# Patient Record
Sex: Male | Born: 1995 | Race: White | Hispanic: No | Marital: Single | Smoking: Former smoker
Health system: Southern US, Community
[De-identification: ages and names within clinical notes are randomized; demographics above are authoritative.]

## PROBLEM LIST (undated history)

## (undated) DIAGNOSIS — G43909 Migraine, unspecified, not intractable, without status migrainosus: Secondary | ICD-10-CM

## (undated) DIAGNOSIS — E079 Disorder of thyroid, unspecified: Secondary | ICD-10-CM

---
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
## 2013-01-21 ENCOUNTER — Other Ambulatory Visit: Payer: Self-pay | Admitting: Family Medicine

## 2013-01-24 NOTE — Telephone Encounter (Signed)
Last thyroid labs were abnormal and you said to recheck in 4-6 weeks, that was 05/26/12

## 2013-01-25 NOTE — Telephone Encounter (Signed)
Patient needs to be seen. Has exceeded time since last visit.he had needed additional labs. Limited quantity refilled. Needs to bring all medications to next appointment.

## 2013-01-26 NOTE — Telephone Encounter (Signed)
[facility] notiifed of refill unabale to reach pt via phone as numbers listed "not in service"

## 2013-01-31 ENCOUNTER — Encounter: Payer: Self-pay | Admitting: *Deleted

## 2013-01-31 NOTE — Telephone Encounter (Signed)
Letter mailed to pt per dr. Maurice March order.

## 2013-03-14 ENCOUNTER — Ambulatory Visit (INDEPENDENT_AMBULATORY_CARE_PROVIDER_SITE_OTHER): Payer: Medicaid Other | Admitting: Family Medicine

## 2013-03-14 ENCOUNTER — Encounter: Payer: Self-pay | Admitting: Family Medicine

## 2013-03-14 VITALS — BP 118/80 | HR 78 | Temp 97.9°F | Resp 18 | Ht 70.5 in | Wt 185.0 lb

## 2013-03-14 DIAGNOSIS — L309 Dermatitis, unspecified: Secondary | ICD-10-CM

## 2013-03-14 DIAGNOSIS — E039 Hypothyroidism, unspecified: Secondary | ICD-10-CM

## 2013-03-14 DIAGNOSIS — J029 Acute pharyngitis, unspecified: Secondary | ICD-10-CM

## 2013-03-14 DIAGNOSIS — L259 Unspecified contact dermatitis, unspecified cause: Secondary | ICD-10-CM

## 2013-03-14 DIAGNOSIS — J039 Acute tonsillitis, unspecified: Secondary | ICD-10-CM

## 2013-03-14 MED ORDER — AMOXICILLIN 500 MG PO CAPS: 500.0000 mg | ORAL_CAPSULE | Freq: Three times a day (TID) | ORAL | Status: DC

## 2013-03-14 MED ORDER — LEVOTHYROXINE SODIUM 75 MCG PO TABS: ORAL_TABLET | ORAL | Status: DC

## 2013-03-14 MED ORDER — HYDROCORTISONE 1 % EX CREA: 1.0000 "application " | TOPICAL_CREAM | Freq: Two times a day (BID) | CUTANEOUS | Status: DC

## 2013-03-14 NOTE — Patient Instructions (Signed)
Return in 3 months for lab work only Start antibiotics Warm salt water gargle Try the topical steroid on your face twice a day and moisturize F/U 6 months for thyroid

## 2013-03-14 NOTE — Assessment & Plan Note (Signed)
History of screen is negative. I would treat more of an acute tonsillitis with amoxicillin

## 2013-03-14 NOTE — Assessment & Plan Note (Signed)
He's not been on the Synthroid consistently of the past couple months due to lack of prescription. I will have him take it for the next 3 months grading he will come in for labs

## 2013-03-14 NOTE — Progress Notes (Signed)
   Subjective:    Patient ID: Mark Zuniga, male    DOB: [birth year], 17 y.o.   MRN: [id number]  HPI Patient here with a sore throat for the past one to 2 days as well as cough. His cough is minimal production. He feels like he has strep throat. He is painful swallowing. His mother also had strep throat a couple weeks ago. He's not had any fever. He's also concerned about a red spot on his face which flakes up and then goes away over the past couple of weeks. He denies any change in soap or lotion on his face. He has no history of eczema   Review of Systems - per above  GEN- denies fatigue, fever, weight loss,weakness, recent illness HEENT- denies eye drainage, change in vision, nasal discharge, CVS- denies chest pain, palpitations RESP- denies SOB, +cough, wheeze Neuro- denies headache, dizziness, syncope, seizure activity      Objective:   Physical Exam GEN- NAD, alert and oriented x3 HEENT- PERRL, EOMI, non injected sclera, pink conjunctiva, MMM, oropharynx +injection, +tonsilar exudates and enlargement,TM clear bilat no effusion,  No  maxillary sinus tenderness, inflammed turbinates,  Nasal drainage  Neck- Supple, no LAD, no thyromegaly CVS- RRR, no murmur RESP-CTAB EXT- No edema Pulses- Radial 2+ Skin- erythematous scaley region along left nasal bridge        Assessment & Plan:

## 2013-03-14 NOTE — Assessment & Plan Note (Signed)
Trial of topical hydrocortisone

## 2013-06-27 ENCOUNTER — Ambulatory Visit (INDEPENDENT_AMBULATORY_CARE_PROVIDER_SITE_OTHER): Payer: Medicaid Other | Admitting: Family Medicine

## 2013-06-27 ENCOUNTER — Encounter: Payer: Self-pay | Admitting: Family Medicine

## 2013-06-27 VITALS — BP 128/68 | HR 88 | Temp 97.6°F | Resp 18 | Ht 71.0 in | Wt 186.0 lb

## 2013-06-27 DIAGNOSIS — L408 Other psoriasis: Secondary | ICD-10-CM

## 2013-06-27 DIAGNOSIS — L309 Dermatitis, unspecified: Secondary | ICD-10-CM

## 2013-06-27 DIAGNOSIS — L259 Unspecified contact dermatitis, unspecified cause: Secondary | ICD-10-CM

## 2013-06-27 DIAGNOSIS — E039 Hypothyroidism, unspecified: Secondary | ICD-10-CM

## 2013-06-27 DIAGNOSIS — L409 Psoriasis, unspecified: Secondary | ICD-10-CM | POA: Insufficient documentation

## 2013-06-27 LAB — CBC WITH DIFFERENTIAL/PLATELET
BASOS PCT: 0 % (ref 0–1)
Basophils Absolute: 0 10*3/uL (ref 0.0–0.1)
Eosinophils Absolute: 0.6 10*3/uL (ref 0.0–0.7)
Eosinophils Relative: 8 % — ABNORMAL HIGH (ref 0–5)
HEMATOCRIT: 48.3 % (ref 39.0–52.0)
Hemoglobin: 16.7 g/dL (ref 13.0–17.0)
LYMPHS PCT: 27 % (ref 12–46)
Lymphs Abs: 2 10*3/uL (ref 0.7–4.0)
MCH: 28.8 pg (ref 26.0–34.0)
MCHC: 34.6 g/dL (ref 30.0–36.0)
MCV: 83.4 fL (ref 78.0–100.0)
MONO ABS: 0.5 10*3/uL (ref 0.1–1.0)
Monocytes Relative: 7 % (ref 3–12)
NEUTROS ABS: 4.3 10*3/uL (ref 1.7–7.7)
NEUTROS PCT: 58 % (ref 43–77)
PLATELETS: 274 10*3/uL (ref 150–400)
RBC: 5.79 MIL/uL (ref 4.22–5.81)
RDW: 13.6 % (ref 11.5–15.5)
WBC: 7.4 10*3/uL (ref 4.0–10.5)

## 2013-06-27 MED ORDER — DERMA-SMOOTHE/FS SCALP 0.01 % EX OIL: TOPICAL_OIL | CUTANEOUS | Status: DC

## 2013-06-27 MED ORDER — TRIAMCINOLONE ACETONIDE 0.1 % EX CREA: 1.0000 "application " | TOPICAL_CREAM | Freq: Two times a day (BID) | CUTANEOUS | Status: DC

## 2013-06-27 NOTE — Progress Notes (Signed)
Patient ID: Mark Zuniga, male   DOB: 08-15-1995, 18 y.o.   MRN: [id number]   Subjective:    Patient ID: Mark Zuniga, male    DOB: 08-15-1995, 18 y.o.   MRN: [id number]  Patient presents for skin flakes and medicaiton review and refill  Here for medication review as well as skin rash. He has a history of hypothyroidism he's not been taking Synthroid daily as prescribed and is overdue for labs. He has noticed that he has a dry flaky patch which turns red on the side of his left nares which continues to recur he did use hydrocortisone in it which I gave his last visit this only helped minimally he now has spots on his scalp and states that he always has very severe dandruff in his use every over-the-counter medication and to shampoo with no improvement he now gets scabs in the scales in his scalp   Note he is on disability 2 to a severe injury in his back Review Of Systems:  GEN- denies fatigue, fever, weight loss,weakness, recent illness HEENT- denies eye drainage, change in vision, nasal discharge, CVS- denies chest pain, palpitations RESP- denies SOB, cough, wheeze MSK- denies joint pain, muscle aches, injury Neuro- denies headache, dizziness, syncope, seizure activity       Objective:    BP 128/68  Pulse 88  Temp(Src) 97.6 F (36.4 C) (Oral)  Resp 18  Ht 5\' 11"  (1.803 m)  Wt 186 lb (84.369 kg)  BMI 25.95 kg/m2 GEN- NAD, alert and oriented x3 HEENT- PERRL, EOMI, non injected sclera, pink conjunctiva, MMM, oropharynx clear Neck- Supple, no thyromegaly CVS- RRR, no murmur RESP-CTAB sKIN- erythematous papules in scalp along front hair line and scattered in back, 1 lesion post pinna,with white scales, dandruff throughout scalp, erythematous scaly lesion on left side nares  EXT- No edema Pulses- Radial, DP- 2+         Assessment & Plan:      Problem List Items Addressed This Visit   Unspecified hypothyroidism - Primary     Check TFT, restart synthroid    Relevant Orders      CBC with Differential      Comprehensive metabolic panel      TSH      T3, Free      T4, Free   Dermatitis      I query if This is a seborrheic dermatitis versus psoriasis in his scalp. I will try him on Derma-Smoothe and also refer him to dermatology I have also given him triamcinolone cream for his face    Relevant Medications      Fluocinolone Acetonide (DERMA-SMOOTHE/FS SCALP) 0.01 % OIL      TRIAMCINOLONE ACETONIDE 0.1% EX CREA      Note: This dictation was prepared with Dragon dictation along with smaller phrase technology. Any transcriptional errors that result from this process are unintentional.

## 2013-06-27 NOTE — Assessment & Plan Note (Signed)
I query if This is a seborrheic dermatitis versus psoriasis in his scalp. I will try him on Derma-Smoothe and also refer him to dermatology I have also given him triamcinolone cream for his face

## 2013-06-27 NOTE — Patient Instructions (Signed)
Apply the new scalp treatment Use a tar based shampoo over the counter Use the cream on face Referral to dermatology We will call with lab results F/U 6 months for physical

## 2013-06-27 NOTE — Assessment & Plan Note (Signed)
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

## 2013-06-27 NOTE — Assessment & Plan Note (Signed)
Check TFT, restart synthroid

## 2013-06-28 LAB — COMPREHENSIVE METABOLIC PANEL
ALBUMIN: 5.2 g/dL (ref 3.5–5.2)
ALK PHOS: 78 U/L (ref 39–117)
ALT: 17 U/L (ref 0–53)
AST: 20 U/L (ref 0–37)
BUN: 10 mg/dL (ref 6–23)
CALCIUM: 9.9 mg/dL (ref 8.4–10.5)
CHLORIDE: 105 meq/L (ref 96–112)
CO2: 24 mEq/L (ref 19–32)
Creat: 0.74 mg/dL (ref 0.50–1.35)
Glucose, Bld: 88 mg/dL (ref 70–99)
POTASSIUM: 4.3 meq/L (ref 3.5–5.3)
SODIUM: 139 meq/L (ref 135–145)
TOTAL PROTEIN: 7.3 g/dL (ref 6.0–8.3)
Total Bilirubin: 0.8 mg/dL (ref 0.2–1.1)

## 2013-06-28 LAB — T4, FREE: Free T4: 1.38 ng/dL (ref 0.80–1.80)

## 2013-06-28 LAB — T3, FREE: T3, Free: 4 pg/mL (ref 2.3–4.2)

## 2013-06-28 LAB — TSH: TSH: 5.843 u[IU]/mL — ABNORMAL HIGH (ref 0.350–4.500)

## 2013-06-29 ENCOUNTER — Other Ambulatory Visit: Payer: Self-pay | Admitting: *Deleted

## 2013-06-29 DIAGNOSIS — E039 Hypothyroidism, unspecified: Secondary | ICD-10-CM

## 2013-06-29 MED ORDER — LEVOTHYROXINE SODIUM 75 MCG PO TABS: ORAL_TABLET | ORAL | Status: DC

## 2013-06-29 NOTE — Telephone Encounter (Signed)
Per orders noted on labs, medication sent to pharmacy.   Call placed to patient and patient made aware. 

## 2013-08-26 ENCOUNTER — Encounter (HOSPITAL_COMMUNITY): Payer: Self-pay | Admitting: Emergency Medicine

## 2013-08-26 ENCOUNTER — Emergency Department (INDEPENDENT_AMBULATORY_CARE_PROVIDER_SITE_OTHER)
Admission: EM | Admit: 2013-08-26 | Discharge: 2013-08-26 | Disposition: A | Discharge status: Home / Self Care | Payer: Medicaid Other | Source: Home / Self Care | Attending: Family Medicine | Admitting: Family Medicine

## 2013-08-26 DIAGNOSIS — J45909 Unspecified asthma, uncomplicated: Secondary | ICD-10-CM

## 2013-08-26 MED ORDER — TRIAMCINOLONE ACETONIDE 40 MG/ML IJ SUSP
INTRAMUSCULAR | Status: AC
  Filled 2013-08-26: qty 1

## 2013-08-26 MED ORDER — AZITHROMYCIN 250 MG PO TABS: ORAL_TABLET | ORAL | Status: DC

## 2013-08-26 MED ORDER — METHYLPREDNISOLONE ACETATE 40 MG/ML IJ SUSP
80.0000 mg | Freq: Once | INTRAMUSCULAR | Status: AC
  Administered 2013-08-26: 80 mg via INTRAMUSCULAR

## 2013-08-26 MED ORDER — HYDROCOD POLST-CHLORPHEN POLST 10-8 MG/5ML PO LQCR: 5.0000 mL | Freq: Two times a day (BID) | ORAL | Status: DC

## 2013-08-26 MED ORDER — METHYLPREDNISOLONE ACETATE 80 MG/ML IJ SUSP
INTRAMUSCULAR | Status: AC
  Filled 2013-08-26: qty 1

## 2013-08-26 MED ORDER — TRIAMCINOLONE ACETONIDE 40 MG/ML IJ SUSP
40.0000 mg | Freq: Once | INTRAMUSCULAR | Status: AC
  Administered 2013-08-26: 40 mg via INTRAMUSCULAR

## 2013-08-26 NOTE — ED Notes (Signed)
Pt  Reports  Symptoms  Of  Cough  /  Congested   And  Pain in  Sides  When  He  Coughs            He  Is  Sitting  Upright on  Exam table     Speaking       In  Complete  sentances  And  Is  In no  distress    Pt is  A  Smoker

## 2013-08-26 NOTE — ED Provider Notes (Signed)
CSN: [id number]     Arrival date & time 08/26/13  2694 History   First MD Initiated Contact with Patient 08/26/13 1055     Chief Complaint  Patient presents with  . Cough   (Consider location/radiation/quality/duration/timing/severity/associated sxs/prior Treatment) Patient is a 18 y.o. male presenting with cough. The history is provided by the patient and a parent.  Cough Cough characteristics:  Non-productive and dry Severity:  Mild Onset quality:  Gradual Duration:  3 days Chronicity:  New Smoker: yes (quit yest.)   Associated symptoms: no chills, no fever, no rhinorrhea, no shortness of breath, no sinus congestion, no sore throat and no wheezing     Past Medical History  Diagnosis Date  . Thyroid disease    History reviewed. No pertinent past surgical history. History reviewed. No pertinent family history. History  Substance Use Topics  . Smoking status: Current Every Day Smoker -- 0.75 packs/day    Types: Cigarettes  . Smokeless tobacco: Not on file  . Alcohol Use: No    Review of Systems  Constitutional: Negative.  Negative for fever and chills.  HENT: Negative for congestion, postnasal drip, rhinorrhea, sinus pressure and sore throat.   Respiratory: Positive for cough. Negative for choking, chest tightness, shortness of breath and wheezing.   Cardiovascular: Negative.   Gastrointestinal: Negative.     Allergies  Sulfa drugs cross reactors  Home Medications   Prior to Admission medications   Medication Sig Start Date End Date Taking? Authorizing Provider  azithromycin (ZITHROMAX Z-PAK) 250 MG tablet Take as directed on pack 08/26/13   Linna Hoff, MD  chlorpheniramine-HYDROcodone [facility] PENNKINETIC ER) 10-8 MG/5ML LQCR Take 5 mLs by mouth every 12 (twelve) hours. 08/26/13   Linna Hoff, MD  Fluocinolone Acetonide (DERMA-SMOOTHE/FS SCALP) 0.01 % OIL Wet scalp apply oil place shower cap on, rinse in AM, 3 times a week 06/27/13   Salley Scarlet, MD   hydrocortisone cream 1 % Apply 1 application topically 2 (two) times daily. 03/14/13   Salley Scarlet, MD  levothyroxine (SYNTHROID, LEVOTHROID) 75 MCG tablet TAKE 1 TABLET BY MOUTH ONCE A DAY 06/29/13   Salley Scarlet, MD  triamcinolone cream (KENALOG) 0.1 % Apply 1 application topically 2 (two) times daily. Apply to face twice a day 06/27/13   Salley Scarlet, MD   BP 129/78  Pulse 70  Temp(Src) 97.5 F (36.4 C) (Oral)  Resp 18  SpO2 97% Physical Exam  Nursing note and vitals reviewed. Constitutional: He is oriented to person, place, and time. He appears well-developed and well-nourished.  HENT:  Right Ear: External ear normal.  Left Ear: External ear normal.  Mouth/Throat: Oropharynx is clear and moist.  Eyes: Conjunctivae are normal. Pupils are equal, round, and reactive to light.  Neck: Normal range of motion. Neck supple.  Cardiovascular: Regular rhythm, normal heart sounds and intact distal pulses.   Pulmonary/Chest: Effort normal and breath sounds normal.  Lymphadenopathy:    He has no cervical adenopathy.  Neurological: He is alert and oriented to person, place, and time.  Skin: Skin is warm and dry.    ED Course  Procedures (including critical care time) Labs Review Labs Reviewed - No data to display  Imaging Review No results found.   MDM   1. Allergic bronchitis        Linna Hoff, MD 08/27/13 1224

## 2013-09-12 ENCOUNTER — Ambulatory Visit: Payer: Medicaid Other | Admitting: Family Medicine

## 2013-11-14 ENCOUNTER — Ambulatory Visit (INDEPENDENT_AMBULATORY_CARE_PROVIDER_SITE_OTHER): Payer: Medicaid Other | Admitting: Family Medicine

## 2013-11-14 ENCOUNTER — Encounter: Payer: Self-pay | Admitting: Family Medicine

## 2013-11-14 VITALS — BP 128/74 | HR 64 | Temp 97.6°F | Resp 12 | Ht 71.0 in | Wt 175.0 lb

## 2013-11-14 DIAGNOSIS — G4489 Other headache syndrome: Secondary | ICD-10-CM

## 2013-11-14 DIAGNOSIS — IMO0002 Reserved for concepts with insufficient information to code with codable children: Secondary | ICD-10-CM

## 2013-11-14 DIAGNOSIS — E039 Hypothyroidism, unspecified: Secondary | ICD-10-CM

## 2013-11-14 DIAGNOSIS — R55 Syncope and collapse: Secondary | ICD-10-CM

## 2013-11-14 DIAGNOSIS — R519 Headache, unspecified: Secondary | ICD-10-CM | POA: Insufficient documentation

## 2013-11-14 DIAGNOSIS — R51 Headache: Secondary | ICD-10-CM

## 2013-11-14 DIAGNOSIS — M5416 Radiculopathy, lumbar region: Secondary | ICD-10-CM

## 2013-11-14 LAB — CBC WITH DIFFERENTIAL/PLATELET
Basophils Absolute: 0 10*3/uL (ref 0.0–0.1)
Basophils Relative: 0 % (ref 0–1)
EOS PCT: 5 % (ref 0–5)
Eosinophils Absolute: 0.6 10*3/uL (ref 0.0–0.7)
HEMATOCRIT: 44.4 % (ref 39.0–52.0)
HEMOGLOBIN: 16.1 g/dL (ref 13.0–17.0)
LYMPHS ABS: 2.4 10*3/uL (ref 0.7–4.0)
Lymphocytes Relative: 21 % (ref 12–46)
MCH: 29.3 pg (ref 26.0–34.0)
MCHC: 36.3 g/dL — ABNORMAL HIGH (ref 30.0–36.0)
MCV: 80.7 fL (ref 78.0–100.0)
MONOS PCT: 6 % (ref 3–12)
Monocytes Absolute: 0.7 10*3/uL (ref 0.1–1.0)
NEUTROS ABS: 7.6 10*3/uL (ref 1.7–7.7)
Neutrophils Relative %: 68 % (ref 43–77)
Platelets: 291 10*3/uL (ref 150–400)
RBC: 5.5 MIL/uL (ref 4.22–5.81)
RDW: 13.6 % (ref 11.5–15.5)
WBC: 11.2 10*3/uL — ABNORMAL HIGH (ref 4.0–10.5)

## 2013-11-14 LAB — COMPREHENSIVE METABOLIC PANEL
ALK PHOS: 81 U/L (ref 39–117)
ALT: 14 U/L (ref 0–53)
AST: 12 U/L (ref 0–37)
Albumin: 5.2 g/dL (ref 3.5–5.2)
BUN: 13 mg/dL (ref 6–23)
CALCIUM: 10.1 mg/dL (ref 8.4–10.5)
CO2: 23 mEq/L (ref 19–32)
CREATININE: 0.78 mg/dL (ref 0.50–1.35)
Chloride: 102 mEq/L (ref 96–112)
Glucose, Bld: 86 mg/dL (ref 70–99)
Potassium: 4.1 mEq/L (ref 3.5–5.3)
Sodium: 140 mEq/L (ref 135–145)
Total Bilirubin: 0.7 mg/dL (ref 0.2–1.2)
Total Protein: 7.6 g/dL (ref 6.0–8.3)

## 2013-11-14 LAB — T3, FREE: T3, Free: 4.5 pg/mL — ABNORMAL HIGH (ref 2.3–4.2)

## 2013-11-14 LAB — T4, FREE: FREE T4: 1.44 ng/dL (ref 0.80–1.80)

## 2013-11-14 LAB — TSH: TSH: 6.473 u[IU]/mL — ABNORMAL HIGH (ref 0.350–4.500)

## 2013-11-14 MED ORDER — DIAZEPAM 5 MG PO TABS: ORAL_TABLET | ORAL | Status: DC

## 2013-11-14 NOTE — Patient Instructions (Signed)
Release of information - [facility]- All records MRI of brain to be done  F/U pending results

## 2013-11-14 NOTE — Progress Notes (Signed)
Patient ID: Mark Zuniga, male   DOB: 1995/05/31, 18 y.o.   MRN: [id number]   Subjective:    Patient ID: Mark Zuniga, male    DOB: 1995/05/31, 18 y.o.   MRN: [id number]  Patient presents for fainting episode  patient here secondary to recurrent syncopal episodes. His mother states he has had syncopal episodes since the age of 18. He's had some workup by our office and previous pediatrician in the past he was seen by cardiology he had a stress test done as well as a Holter monitor which was negative. Sometimes these episodes were associated with severe back pain he is [facility] Dr. 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 Early this morning he had another one of the syncopal episodes that he did not have any pain associated. He does walk around the house when he became subsequently dizzy he also had a headache went to lay down on his bed and his cousin witnessed that he fell backwards on his bed his eyes roll in his head and that his arms curled. Patient states that he felt dizzy therefore he went and laid down and he thinks that he went to sleep he is not sure about the arms curled. His mother is very concerned as she started with headaches and syncopal episodes when her brain aneurysm was diagnosed which had to be coiled    Review Of Systems:  GEN- denies fatigue, fever, weight loss,weakness, recent illness HEENT- denies eye drainage, change in vision, nasal discharge, CVS- denies chest pain, palpitations RESP- denies SOB, cough, wheeze ABD- denies N/V, change in stools, abd pain GU- denies dysuria, hematuria, dribbling, incontinence MSK- + joint pain, muscle aches, injury Neuro+s headache, +dizziness, +syncope, seizure activity       Objective:    BP 128/74  Pulse 64  Temp(Src) 97.6 F (36.4 C)  (Oral)  Resp 12  Ht 5\' 11"  (1.803 m)  Wt 175 lb (79.379 kg)  BMI 24.42 kg/m2 GEN- NAD, alert and oriented x3 HEENT- PERRL, EOMI, non injected sclera, pink conjunctiva, MMM, oropharynx clear Neck- Supple, no thyromegaly CVS- RRR, no murmur RESP-CTAB NEURO-CNII-XII in tact, DTR symmetric, sensation in tact, motor equal bilat, normal speech Psych- normal affect and mood EXT- No edema Pulses- Radial 2+        Assessment & Plan:      Problem List Items Addressed This Visit   Unspecified hypothyroidism   Relevant Orders      TSH      T3, free      T4, free   Syncope   Relevant Orders      CBC with Differential      Comprehensive metabolic panel   Radiculopathy of lumbar region - Primary   Relevant Medications      diazepam (VALIUM) tablet   Headache      Note: This dictation was prepared with Dragon dictation along with smaller phrase technology. Any transcriptional errors that result from this process are unintentional.

## 2013-11-16 ENCOUNTER — Encounter: Payer: Self-pay | Admitting: Family Medicine

## 2013-11-16 NOTE — Assessment & Plan Note (Signed)
Discussed importance of taking medications on regular basis

## 2013-11-16 NOTE — Assessment & Plan Note (Signed)
Significant work up in the past, I will review his chart  Obtain MRI of brain family history of anuersym in setting of continued symptoms and headaches, not sure about correlation with back pain

## 2013-12-06 ENCOUNTER — Other Ambulatory Visit: Payer: Self-pay | Admitting: Family Medicine

## 2013-12-06 DIAGNOSIS — R55 Syncope and collapse: Secondary | ICD-10-CM

## 2013-12-06 DIAGNOSIS — G4489 Other headache syndrome: Secondary | ICD-10-CM

## 2013-12-06 DIAGNOSIS — Z8679 Personal history of other diseases of the circulatory system: Secondary | ICD-10-CM

## 2013-12-11 ENCOUNTER — Telehealth: Payer: Self-pay | Admitting: *Deleted

## 2013-12-11 ENCOUNTER — Telehealth: Payer: Self-pay | Admitting: Family Medicine

## 2013-12-11 DIAGNOSIS — Z8249 Family history of ischemic heart disease and other diseases of the circulatory system: Secondary | ICD-10-CM

## 2013-12-11 NOTE — Telephone Encounter (Signed)
Medication ordered on 11/14/2013.  Call placed to patient mother. Reports that pharmacy does prescription.   Medication called to pharmacy.

## 2013-12-11 NOTE — Telephone Encounter (Signed)
[phone] Marylu Lund hurd mother is calling to make sure that we called in some medication before Mark Zuniga had his mri done

## 2013-12-11 NOTE — Telephone Encounter (Signed)
Received call from [facility] at [facility] stating that pt has appt on Wednesday for MRI and she stated that the radilogy tech is requesting for pt to also have MRA of Head. ?OK to do referral?

## 2013-12-11 NOTE — Telephone Encounter (Signed)
Okay, this is due to Anuerysm in family history

## 2013-12-12 NOTE — Telephone Encounter (Signed)
Referral place for MRA

## 2013-12-13 ENCOUNTER — Ambulatory Visit
Admission: RE | Admit: 2013-12-13 | Discharge: 2013-12-13 | Disposition: A | Discharge status: Home / Self Care | Payer: Medicaid Other | Source: Ambulatory Visit | Attending: Family Medicine | Admitting: Family Medicine

## 2013-12-13 ENCOUNTER — Telehealth: Payer: Self-pay | Admitting: Family Medicine

## 2013-12-13 DIAGNOSIS — R55 Syncope and collapse: Secondary | ICD-10-CM

## 2013-12-13 DIAGNOSIS — Z8679 Personal history of other diseases of the circulatory system: Secondary | ICD-10-CM

## 2013-12-13 DIAGNOSIS — G4489 Other headache syndrome: Secondary | ICD-10-CM

## 2013-12-13 DIAGNOSIS — Z8249 Family history of ischemic heart disease and other diseases of the circulatory system: Secondary | ICD-10-CM

## 2013-12-14 ENCOUNTER — Telehealth: Payer: Self-pay | Admitting: Family Medicine

## 2013-12-14 NOTE — Telephone Encounter (Signed)
Patients mom is calling about his mri results let her know that dr Jeanice Lim and you as well would be back tomorrow if those results were in  [phone]

## 2013-12-15 ENCOUNTER — Telehealth: Payer: Self-pay | Admitting: Family Medicine

## 2013-12-15 ENCOUNTER — Other Ambulatory Visit: Payer: Self-pay | Admitting: *Deleted

## 2013-12-15 MED ORDER — TOPIRAMATE 25 MG PO TABS: 25.0000 mg | ORAL_TABLET | Freq: Every day | ORAL | Status: DC

## 2013-12-15 NOTE — Telephone Encounter (Signed)
PATIENTS MOM JANET CALLING TO TALK WITH YOU REGARDING Kamarrion'S MRI RESULTS SHE SAID THAT WHEN YOU SPOKE TO Yosmar THIS MORNING HE WAS HALF ASLEEP AND WANTED TO MAKE SURE THAT EVERY THING WAS UNDERSTOOD AND WAS CLEAR   [phone]

## 2013-12-15 NOTE — Telephone Encounter (Signed)
Call placed to patient and patient made aware.  

## 2013-12-15 NOTE — Telephone Encounter (Signed)
Returned call to patient mother & results given again.

## 2013-12-27 ENCOUNTER — Encounter: Payer: Medicaid Other | Admitting: Family Medicine

## 2013-12-29 ENCOUNTER — Ambulatory Visit: Payer: Medicaid Other | Admitting: Family Medicine

## 2014-01-07 ENCOUNTER — Encounter (HOSPITAL_COMMUNITY): Payer: Self-pay | Admitting: Emergency Medicine

## 2014-01-07 ENCOUNTER — Emergency Department (HOSPITAL_COMMUNITY)
Admission: EM | Admit: 2014-01-07 | Discharge: 2014-01-07 | Disposition: A | Discharge status: Home / Self Care | Payer: Medicaid Other | Attending: Emergency Medicine | Admitting: Emergency Medicine

## 2014-01-07 DIAGNOSIS — Z7952 Long term (current) use of systemic steroids: Secondary | ICD-10-CM | POA: Diagnosis not present

## 2014-01-07 DIAGNOSIS — R51 Headache: Secondary | ICD-10-CM

## 2014-01-07 DIAGNOSIS — E079 Disorder of thyroid, unspecified: Secondary | ICD-10-CM | POA: Diagnosis not present

## 2014-01-07 DIAGNOSIS — G43909 Migraine, unspecified, not intractable, without status migrainosus: Secondary | ICD-10-CM | POA: Diagnosis present

## 2014-01-07 DIAGNOSIS — Z87891 Personal history of nicotine dependence: Secondary | ICD-10-CM | POA: Insufficient documentation

## 2014-01-07 DIAGNOSIS — R519 Headache, unspecified: Secondary | ICD-10-CM

## 2014-01-07 HISTORY — DX: Migraine, unspecified, not intractable, without status migrainosus: G43.909

## 2014-01-07 MED ORDER — KETOROLAC TROMETHAMINE 60 MG/2ML IM SOLN
60.0000 mg | Freq: Once | INTRAMUSCULAR | Status: AC
  Administered 2014-01-07: 60 mg via INTRAMUSCULAR
  Filled 2014-01-07: qty 2

## 2014-01-07 MED ORDER — DIPHENHYDRAMINE HCL 50 MG/ML IJ SOLN
25.0000 mg | Freq: Once | INTRAMUSCULAR | Status: AC
  Administered 2014-01-07: 25 mg via INTRAMUSCULAR
  Filled 2014-01-07: qty 1

## 2014-01-07 MED ORDER — METOCLOPRAMIDE HCL 5 MG/ML IJ SOLN
10.0000 mg | Freq: Once | INTRAMUSCULAR | Status: AC
  Administered 2014-01-07: 10 mg via INTRAMUSCULAR
  Filled 2014-01-07: qty 2

## 2014-01-07 NOTE — ED Notes (Signed)
Pt. reports migraine headache with nausea and blurred vision onset this evening unrelieved by prescription Topamax.

## 2014-01-07 NOTE — ED Provider Notes (Signed)
CSN: [id number]     Arrival date & time 01/07/14  0006 History   First MD Initiated Contact with Patient 01/07/14 0159     Chief Complaint  Patient presents with  . Migraine     (Consider location/radiation/quality/duration/timing/severity/associated sxs/prior Treatment) HPI Comments: Patient here complaining of worsening chronic migraine since yesterday afternoon. No known trigger. Headache is localized to his right frontal region and is similar to prior migraines. He notes nausea without vomiting. No fever or chills. No neck pain or stiffness. Patient notes photophobia without phonophobia. Denies any unilateral weakness. Take a dose of Topamax without relief. Patient was on chronic Topamax therapy but recently stopped that about a week ago. Nothing makes his symptoms better.  Patient is a 18 y.o. male presenting with migraines. The history is provided by the patient and a parent.  Migraine    Past Medical History  Diagnosis Date  . Thyroid disease   . Migraine    History reviewed. No pertinent past surgical history. No family history on file. History  Substance Use Topics  . Smoking status: Former Smoker -- 0.75 packs/day    Types: Cigarettes  . Smokeless tobacco: Never Used     Comment: quit 8/14/21015  . Alcohol Use: No    Review of Systems  All other systems reviewed and are negative.     Allergies  Sulfa drugs cross reactors  Home Medications   Prior to Admission medications   Medication Sig Start Date End Date Taking? Authorizing Provider  diazepam (VALIUM) 5 MG tablet Take 1 hour before MRI 11/14/13   Salley ScarletKawanta F [place], MD  hydrocortisone cream 1 % Apply 1 application topically 2 (two) times daily. 03/14/13   Salley ScarletKawanta F [place], MD  levothyroxine (SYNTHROID, LEVOTHROID) 75 MCG tablet TAKE 1 TABLET BY MOUTH ONCE A DAY 06/29/13   Salley ScarletKawanta F Robards, MD  topiramate (TOPAMAX) 25 MG tablet Take 1 tablet (25 mg total) by mouth at bedtime. 12/15/13   Salley ScarletKawanta F Blanco, MD   triamcinolone cream (KENALOG) 0.1 % Apply 1 application topically 2 (two) times daily. Apply to face twice a day 06/27/13   Salley ScarletKawanta F Chino, MD   BP 136/74  Pulse 72  Temp(Src) 98.4 F (36.9 C) (Oral)  Resp 18  Ht 5\' 4"  (1.626 m)  Wt 180 lb (81.647 kg)  BMI 30.88 kg/m2  SpO2 98% Physical Exam  Nursing note and vitals reviewed. Constitutional: He is oriented to person, place, and time. He appears well-developed and well-nourished.  Non-toxic appearance. No distress.  HENT:  Head: Normocephalic and atraumatic.  Eyes: Conjunctivae, EOM and lids are normal. Pupils are equal, round, and reactive to light.  Neck: Normal range of motion. Neck supple. No tracheal deviation present. No mass present.  Cardiovascular: Normal rate, regular rhythm and normal heart sounds.  Exam reveals no gallop.   No murmur heard. Pulmonary/Chest: Effort normal and breath sounds normal. No stridor. No respiratory distress. He has no decreased breath sounds. He has no wheezes. He has no rhonchi. He has no rales.  Abdominal: Soft. Normal appearance and bowel sounds are normal. He exhibits no distension. There is no tenderness. There is no rebound and no CVA tenderness.  Musculoskeletal: Normal range of motion. He exhibits no edema and no tenderness.  Neurological: He is alert and oriented to person, place, and time. He has normal strength. No cranial nerve deficit or sensory deficit. GCS eye subscore is 4. GCS verbal subscore is 5. GCS motor subscore is 6.  Skin: Skin  is warm and dry. No abrasion and no rash noted.  Psychiatric: He has a normal mood and affect. His speech is normal and behavior is normal.    ED Course  Procedures (including critical care time) Labs Review Labs Reviewed - No data to display  Imaging Review No results found.   EKG Interpretation None      MDM   Final diagnoses:  None    Patient given medications for headache here and feels better. No concern for meningitis or  subarachnoid hemorrhage. Stable for discharge    Toy BakerAnthony T Romulus Hanrahan, MD 01/07/14 [phone]

## 2014-01-07 NOTE — Discharge Instructions (Signed)

## 2014-01-07 NOTE — ED Notes (Signed)
Pt. Refused wheelchair 

## 2014-01-22 ENCOUNTER — Ambulatory Visit (INDEPENDENT_AMBULATORY_CARE_PROVIDER_SITE_OTHER): Payer: Medicaid Other | Admitting: Family Medicine

## 2014-01-22 ENCOUNTER — Encounter: Payer: Self-pay | Admitting: Family Medicine

## 2014-01-22 VITALS — BP 118/64 | HR 68 | Temp 97.6°F | Resp 14 | Ht 70.0 in | Wt 183.0 lb

## 2014-01-22 DIAGNOSIS — G43709 Chronic migraine without aura, not intractable, without status migrainosus: Secondary | ICD-10-CM

## 2014-01-22 DIAGNOSIS — Z23 Encounter for immunization: Secondary | ICD-10-CM

## 2014-01-22 DIAGNOSIS — E031 Congenital hypothyroidism without goiter: Secondary | ICD-10-CM

## 2014-01-22 DIAGNOSIS — Z0000 Encounter for general adult medical examination without abnormal findings: Secondary | ICD-10-CM | POA: Insufficient documentation

## 2014-01-22 DIAGNOSIS — M4306 Spondylolysis, lumbar region: Secondary | ICD-10-CM

## 2014-01-22 DIAGNOSIS — G43909 Migraine, unspecified, not intractable, without status migrainosus: Secondary | ICD-10-CM | POA: Insufficient documentation

## 2014-01-22 LAB — T3, FREE: T3, Free: 4.2 pg/mL (ref 2.3–4.2)

## 2014-01-22 LAB — T4, FREE: FREE T4: 1.58 ng/dL (ref 0.80–1.80)

## 2014-01-22 LAB — TSH: TSH: 6.648 u[IU]/mL — ABNORMAL HIGH (ref 0.350–4.500)

## 2014-01-22 MED ORDER — TRAMADOL-ACETAMINOPHEN 37.5-325 MG PO TABS: 1.0000 | ORAL_TABLET | Freq: Four times a day (QID) | ORAL | Status: DC | PRN

## 2014-01-22 MED ORDER — PROPRANOLOL HCL ER 60 MG PO CP24: 60.0000 mg | ORAL_CAPSULE | Freq: Every day | ORAL | Status: DC

## 2014-01-22 NOTE — Assessment & Plan Note (Signed)
CPE done, flu shot given Declines STD check

## 2014-01-22 NOTE — Patient Instructions (Signed)
Take pain medication as prescribed Try the new migraine medication at bedtime Flu shot given We will call with thyroid labs F/U 2 months

## 2014-01-22 NOTE — Assessment & Plan Note (Signed)
Repeat thoracic and lumbar imaging ultracet short term given

## 2014-01-22 NOTE — Assessment & Plan Note (Signed)
Recheck TFT, continue synthroid 

## 2014-01-22 NOTE — Assessment & Plan Note (Signed)
Trial of Inderal 60 mg once a day, continue ibuprofen

## 2014-01-22 NOTE — Progress Notes (Signed)
Patient ID: Mark Zuniga, male   DOB: 28-Dec-1995, 18 y.o.   MRN: [id number]   Subjective:    Patient ID: Mark Zuniga, male    DOB: 28-Dec-1995, 18 y.o.   MRN: [id number]  Patient presents for CPE and Back Pain  Patient here for physical exam. His immunizations were reviewed he is due for a flu shot. He has history of hypothyroidism he states he has been taking his medication on a regular basis he only missed 2 days. He also has a history of migraine disorder was seen in the ER 2 weeks ago secondary to severe migraine he was on Topamax states this causes insomnia therefore he discontinued it. He did try taking ibuprofen with minimal improvement.  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 He was seen by orthopedics in the past and they were going to give epidural injections of some sort however his insurance did not cover it. He states that he takes over-the-counter Tylenol and ibuprofen all the time for his back pain sometimes it is so severe he feels like he is going to pass out. Note his back pain same from an accident that he had a couple years ago when he fell onto a rock  Denies sexual activity Does not work, not in school Denies ETOH  Review Of Systems:  GEN- denies fatigue, fever, weight loss,weakness, recent illness HEENT- denies eye drainage, change in vision, nasal discharge, CVS- denies chest pain, palpitations RESP- denies SOB, cough, wheeze ABD- denies N/V, change in stools, abd pain GU- denies dysuria, hematuria, dribbling, incontinence MSK- + joint pain, muscle aches, injury Neuro- denies headache, dizziness, syncope, seizure activity       Objective:    BP 118/64  Pulse 68  Temp(Src) 97.6 F (36.4 C) (Oral)  Resp 14  Ht 5\' 10"  (1.778 m)  Wt 183 lb (83.008 kg)  BMI 26.26 kg/m2 GEN- NAD, alert and oriented x3 HEENT- PERRL, EOMI,  non injected sclera, pink conjunctiva, MMM, oropharynx clear Neck- Supple, no thyromegaly CVS- RRR, no murmur RESP-CTAB Spine- TTP lower thoracic and Lumbar spine, no paraspinal tenderness, no rebound, no guarding EXT- No edema Pulses- Radial, DP- 2+        Assessment & Plan:      Problem List Items Addressed This Visit   Routine general medical examination at a health care facility     CPE done, flu shot given Declines STD check    Pars defect of lumbar spine     Repeat thoracic and lumbar imaging ultracet short term given    Relevant Orders      DG Thoracic Spine W/Swimmers      DG Lumbar Spine Complete   Migraines     Trial of Inderal 60 mg once a day, continue ibuprofen    Relevant Medications      propranolol (INDERAL LA) 24 hr capsule      ULTRACET 37.5-325 MG PO TABS   Hypothyroidism     Recheck TFT, continue synthroid    Relevant Medications      propranolol (INDERAL LA) 24 hr capsule   Other Relevant Orders      TSH      T3, free      T4, free    Other Visit Diagnoses   Need for prophylactic vaccination and inoculation against influenza    -  Primary    Relevant Orders       Flu Vaccine QUAD 36+ mos PF IM (Fluarix Quad PF) (Completed)       Note: This dictation was prepared with Dragon dictation along with smaller phrase technology. Any transcriptional errors that result from this process are unintentional.

## 2014-01-24 ENCOUNTER — Other Ambulatory Visit: Payer: Self-pay | Admitting: *Deleted

## 2014-01-24 MED ORDER — LEVOTHYROXINE SODIUM 88 MCG PO TABS: 88.0000 ug | ORAL_TABLET | Freq: Every day | ORAL | Status: DC

## 2014-02-12 ENCOUNTER — Ambulatory Visit: Payer: Self-pay | Admitting: Family Medicine

## 2014-05-02 ENCOUNTER — Ambulatory Visit
Admission: RE | Admit: 2014-05-02 | Discharge: 2014-05-02 | Disposition: A | Discharge status: Home / Self Care | Payer: Medicaid Other | Source: Ambulatory Visit | Attending: Family Medicine | Admitting: Family Medicine

## 2014-05-02 DIAGNOSIS — M4306 Spondylolysis, lumbar region: Secondary | ICD-10-CM

## 2014-07-11 ENCOUNTER — Encounter: Payer: Self-pay | Admitting: Family Medicine

## 2014-07-11 ENCOUNTER — Ambulatory Visit (INDEPENDENT_AMBULATORY_CARE_PROVIDER_SITE_OTHER): Payer: Medicaid Other | Admitting: Family Medicine

## 2014-07-11 VITALS — BP 120/80 | HR 76 | Temp 98.3°F | Resp 18 | Ht 71.0 in | Wt 188.0 lb

## 2014-07-11 DIAGNOSIS — J9801 Acute bronchospasm: Secondary | ICD-10-CM

## 2014-07-11 MED ORDER — FLUTICASONE PROPIONATE 50 MCG/ACT NA SUSP: 2.0000 | Freq: Every day | NASAL | Status: DC

## 2014-07-11 MED ORDER — ALBUTEROL SULFATE (2.5 MG/3ML) 0.083% IN NEBU: 2.5000 mg | INHALATION_SOLUTION | Freq: Four times a day (QID) | RESPIRATORY_TRACT | Status: DC | PRN

## 2014-07-11 NOTE — Progress Notes (Signed)
Subjective:    Patient ID: Mark Zuniga, male    DOB: 1995/08/14, 19 y.o.   MRN: [id number]  HPI  Patient reports having severe coughing spells over the last 5-6 days. The spells will come frequently throughout the day. He wheezes. He cannot catch his breath. At times he will feel like he is going to have posttussive emesis. Here. It is productive of yellow and occasionally green sputum. He does report allergies prior to this starting. He is having some rhinorrhea and nasal congestion. He does have a history of asthma when he was a child. He denies any chest pain shortness of breath or dyspnea on exertion.    Past Medical History  Diagnosis Date  . Thyroid disease   . Migraine    No past surgical history on file.  Current Outpatient Prescriptions on File Prior to Visit  Medication Sig Dispense Refill  . ibuprofen (ADVIL,MOTRIN) 200 MG tablet Take 200-600 mg by mouth every 6 (six) hours as needed for headache.    . levothyroxine (SYNTHROID, LEVOTHROID) 88 MCG tablet Take 1 tablet (88 mcg total) by mouth daily. 90 tablet 3  . propranolol ER (INDERAL LA) 60 MG 24 hr capsule Take 1 capsule (60 mg total) by mouth at bedtime. 30 capsule 3  . traMADol-acetaminophen (ULTRACET) 37.5-325 MG per tablet Take 1 tablet by mouth every 6 (six) hours as needed. 30 tablet 1   No current facility-administered medications on file prior to visit.   Allergies  Allergen Reactions  . Sulfa Drugs Cross Reactors Other (See Comments)    unknown   History   Social History  . Marital Status: Single    Spouse Name: N/A  . Number of Children: N/A  . Years of Education: N/A   Occupational History  . Not on file.   Social History Main Topics  . Smoking status: Former Smoker -- 0.75 packs/day    Types: Cigarettes  . Smokeless tobacco: Never Used     Comment: quit 8/14/21015  . Alcohol Use: No  . Drug Use: No  . Sexual Activity: No   Other Topics Concern  . Not on file   Social History  Narrative  . No narrative on file      Review of Systems  All other systems reviewed and are negative.      Objective:   Physical Exam  Constitutional: He appears well-developed and well-nourished.  HENT:  Right Ear: External ear normal.  Left Ear: External ear normal.  Nose: Nose normal.  Mouth/Throat: Oropharynx is clear and moist. No oropharyngeal exudate.  Eyes: Conjunctivae are normal. Pupils are equal, round, and reactive to light.  Neck: Neck supple.  Cardiovascular: Normal rate, regular rhythm and normal heart sounds.   No murmur heard. Pulmonary/Chest: Effort normal. No respiratory distress. He has wheezes. He has no rales.  Lymphadenopathy:    He has no cervical adenopathy.  Vitals reviewed.         Assessment & Plan:  Bronchospasm - Plan: fluticasone (FLONASE) 50 MCG/ACT nasal spray, albuterol (PROVENTIL) (2.5 MG/3ML) 0.083% nebulizer solution  I believe the patient is having bronchospasms related to seasonal allergies. I recommended using Flonase 2 sprays each nostril daily to help control the allergies. I recommended using albuterol 2 puffs inhaled every 6 hours as needed for wheezing and coughing. If no better or if worsening consider a prednisone Dosepak to treat asthma. Hopefully at the present time, if we can control the inciting event which I believe was allergies  and treat the bronchospasms with albuterol, his cough will improve

## 2014-07-12 ENCOUNTER — Other Ambulatory Visit: Payer: Self-pay | Admitting: Family Medicine

## 2014-07-12 MED ORDER — ALBUTEROL SULFATE HFA 108 (90 BASE) MCG/ACT IN AERS: 2.0000 | INHALATION_SPRAY | Freq: Four times a day (QID) | RESPIRATORY_TRACT | Status: DC | PRN

## 2014-07-12 NOTE — Telephone Encounter (Signed)
Order for MDI was for Nebulizer medicine.  Order corrected per OV note.

## 2014-09-05 ENCOUNTER — Encounter: Payer: Self-pay | Admitting: Family Medicine

## 2014-09-05 ENCOUNTER — Ambulatory Visit (INDEPENDENT_AMBULATORY_CARE_PROVIDER_SITE_OTHER): Payer: Medicaid Other | Admitting: Family Medicine

## 2014-09-05 VITALS — BP 118/72 | HR 84 | Temp 98.2°F | Resp 16 | Ht 71.0 in | Wt 186.0 lb

## 2014-09-05 DIAGNOSIS — E031 Congenital hypothyroidism without goiter: Secondary | ICD-10-CM

## 2014-09-05 DIAGNOSIS — M4306 Spondylolysis, lumbar region: Secondary | ICD-10-CM | POA: Diagnosis not present

## 2014-09-05 DIAGNOSIS — R21 Rash and other nonspecific skin eruption: Secondary | ICD-10-CM | POA: Diagnosis not present

## 2014-09-05 DIAGNOSIS — B070 Plantar wart: Secondary | ICD-10-CM | POA: Diagnosis not present

## 2014-09-05 LAB — CBC WITH DIFFERENTIAL/PLATELET
Basophils Absolute: 0 10*3/uL (ref 0.0–0.1)
Basophils Relative: 0 % (ref 0–1)
EOS PCT: 2 % (ref 0–5)
Eosinophils Absolute: 0.2 10*3/uL (ref 0.0–0.7)
HCT: 47.4 % (ref 39.0–52.0)
Hemoglobin: 16.6 g/dL (ref 13.0–17.0)
LYMPHS ABS: 1.8 10*3/uL (ref 0.7–4.0)
Lymphocytes Relative: 20 % (ref 12–46)
MCH: 29.3 pg (ref 26.0–34.0)
MCHC: 35 g/dL (ref 30.0–36.0)
MCV: 83.7 fL (ref 78.0–100.0)
MPV: 9.7 fL (ref 8.6–12.4)
Monocytes Absolute: 0.6 10*3/uL (ref 0.1–1.0)
Monocytes Relative: 7 % (ref 3–12)
NEUTROS ABS: 6.5 10*3/uL (ref 1.7–7.7)
Neutrophils Relative %: 71 % (ref 43–77)
Platelets: 279 10*3/uL (ref 150–400)
RBC: 5.66 MIL/uL (ref 4.22–5.81)
RDW: 13.1 % (ref 11.5–15.5)
WBC: 9.1 10*3/uL (ref 4.0–10.5)

## 2014-09-05 LAB — BASIC METABOLIC PANEL
BUN: 11 mg/dL (ref 6–23)
CO2: 24 meq/L (ref 19–32)
CREATININE: 0.79 mg/dL (ref 0.50–1.35)
Calcium: 9.8 mg/dL (ref 8.4–10.5)
Chloride: 103 mEq/L (ref 96–112)
Glucose, Bld: 100 mg/dL — ABNORMAL HIGH (ref 70–99)
Potassium: 4.2 mEq/L (ref 3.5–5.3)
SODIUM: 141 meq/L (ref 135–145)

## 2014-09-05 LAB — T4, FREE: Free T4: 1.57 ng/dL (ref 0.80–1.80)

## 2014-09-05 LAB — T3, FREE: T3, Free: 5 pg/mL — ABNORMAL HIGH (ref 2.3–4.2)

## 2014-09-05 LAB — TSH: TSH: 6.615 u[IU]/mL — ABNORMAL HIGH (ref 0.350–4.500)

## 2014-09-05 MED ORDER — CYCLOBENZAPRINE HCL 5 MG PO TABS: 5.0000 mg | ORAL_TABLET | Freq: Two times a day (BID) | ORAL | Status: DC | PRN

## 2014-09-05 MED ORDER — ACETAMINOPHEN-CODEINE #3 300-30 MG PO TABS: 1.0000 | ORAL_TABLET | Freq: Four times a day (QID) | ORAL | Status: DC | PRN

## 2014-09-05 MED ORDER — METHYLPREDNISOLONE 4 MG PO TBPK: ORAL_TABLET | ORAL | Status: DC

## 2014-09-05 NOTE — Assessment & Plan Note (Signed)
Chronic back pain, on SSI due to this, xray of thoracic region neg significnat spasm today Given flexeril , Tylenol #3 Refer to spine specialist for evaluation

## 2014-09-05 NOTE — Assessment & Plan Note (Signed)
Unclear cause of rash, does not fit hand foot, mouth, or RMSF, ? Contact dermatitis With back pain and rash, treat with steroids

## 2014-09-05 NOTE — Progress Notes (Signed)
Patient ID: Mark Zuniga, male   DOB: 1996-03-27, 19 y.o.   MRN: [id number]   Subjective:    Patient ID: Mark Zuniga, male    DOB: 1996-03-27, 19 y.o.   MRN: [id number]  Patient presents for Skin Irritation and Back Pain  patient here with rash to bilateral hands and feet for the past 2 weeks. He states he has had itching all over his hands and red spots that come up some of the area swelled up within now gone down. He also has some itching and one spot that is tender on his left foot. He denies any fever and no recent illness denies any tick bites. He has tried calamine lotion for the itching on his hands.  Continues to have a lot of joint pain he has history of pars defect I did do x-ray of his thoracic spine which was normal. He's been treated on and off her chronic back pain since he was a teenager. He states at times the pain is so bad he has spasms all around his spine and he cannot get up to move around.    Review Of Systems:  GEN- denies fatigue, fever, weight loss,weakness, recent illness HEENT- denies eye drainage, change in vision, nasal discharge, CVS- denies chest pain, palpitations RESP- denies SOB, cough, wheeze ABD- denies N/V, change in stools, abd pain GU- denies dysuria, hematuria, dribbling, incontinence MSK- + joint pain, muscle aches, injury Neuro- denies headache, dizziness, syncope, seizure activity       Objective:    BP 118/72 mmHg  Pulse 84  Temp(Src) 98.2 F (36.8 C) (Oral)  Resp 16  Ht 5\' 11"  (1.803 m)  Wt 186 lb (84.369 kg)  BMI 25.95 kg/m2 GEN- NAD, alert and oriented x3 MSK- Skin- plantar wart lateral edge of left foot, 2 small erythematous macules on dosum of right foot, no lesions on soles, Hands- erythematous maculues scattered on palms, fingers,, no scales, no blisters MSK- TTP lumbar and thoracic spine, +paraspinal spasm, fair ROM, neg SLR EXT- No edema Pulses- Radial  2+  Procedure- Cryotherapy to plantar wart Procedure  explained to patient questions answered benefits and risks discussed verbal consent obtained. Antiseptic-none Liquid Nitrogen via spray gun 5 sec pass x 3  Patient tolerated procedure well Bandage applied         Assessment & Plan:      Problem List Items Addressed This Visit    Rash and nonspecific skin eruption   Relevant Orders   CBC with Differential/Platelet   Pars defect of lumbar spine   Hypothyroidism - Primary   Relevant Orders   TSH   T3, free   T4, free   Basic metabolic panel    Other Visit Diagnoses    Plantar wart           Note: This dictation was prepared with Dragon dictation along with smaller phrase technology. Any transcriptional errors that result from this process are unintentional.

## 2014-09-05 NOTE — Patient Instructions (Signed)
Muscle relaxer Tylenol #3 for pain  Steroid for the rash  We will call with labs Referral to spine specialist  F/U 6 months

## 2015-03-08 ENCOUNTER — Ambulatory Visit: Payer: Medicaid Other | Admitting: Family Medicine

## 2015-03-12 ENCOUNTER — Other Ambulatory Visit: Payer: Self-pay | Admitting: Rehabilitation

## 2015-03-12 DIAGNOSIS — M4306 Spondylolysis, lumbar region: Secondary | ICD-10-CM

## 2015-03-13 ENCOUNTER — Ambulatory Visit
Admission: RE | Admit: 2015-03-13 | Discharge: 2015-03-13 | Disposition: A | Discharge status: Home / Self Care | Payer: Medicaid Other | Source: Ambulatory Visit | Attending: Rehabilitation | Admitting: Rehabilitation

## 2015-03-13 DIAGNOSIS — M4306 Spondylolysis, lumbar region: Secondary | ICD-10-CM

## 2015-06-21 ENCOUNTER — Telehealth: Payer: Self-pay | Admitting: Family Medicine

## 2015-06-21 MED ORDER — LEVOTHYROXINE SODIUM 88 MCG PO TABS: 88.0000 ug | ORAL_TABLET | Freq: Every day | ORAL | Status: DC

## 2015-06-21 NOTE — Telephone Encounter (Signed)
Mark LundJanet calling requesting a prescription called into [facility] [address]  levothyroxine (SYNTHROID, LEVOTHROID) 88 MCG   CB# [phone]

## 2015-06-21 NOTE — Telephone Encounter (Signed)
Medication filled x1 with no refills.   Requires office visit before any further refills can be given.   Letter sent.  

## 2015-07-05 ENCOUNTER — Encounter: Payer: Self-pay | Admitting: Family Medicine

## 2015-07-05 ENCOUNTER — Ambulatory Visit (INDEPENDENT_AMBULATORY_CARE_PROVIDER_SITE_OTHER): Payer: Medicaid Other | Admitting: Family Medicine

## 2015-07-05 VITALS — BP 120/70 | HR 80 | Temp 98.0°F | Resp 18 | Wt 200.0 lb

## 2015-07-05 DIAGNOSIS — E031 Congenital hypothyroidism without goiter: Secondary | ICD-10-CM

## 2015-07-05 DIAGNOSIS — L409 Psoriasis, unspecified: Secondary | ICD-10-CM

## 2015-07-05 DIAGNOSIS — B079 Viral wart, unspecified: Secondary | ICD-10-CM | POA: Diagnosis not present

## 2015-07-05 DIAGNOSIS — R635 Abnormal weight gain: Secondary | ICD-10-CM

## 2015-07-05 DIAGNOSIS — F39 Unspecified mood [affective] disorder: Secondary | ICD-10-CM

## 2015-07-05 DIAGNOSIS — L309 Dermatitis, unspecified: Secondary | ICD-10-CM

## 2015-07-05 DIAGNOSIS — Z23 Encounter for immunization: Secondary | ICD-10-CM

## 2015-07-05 LAB — CBC WITH DIFFERENTIAL/PLATELET
Basophils Absolute: 0 {cells}/uL (ref 0–200)
Basophils Relative: 0 %
Eosinophils Absolute: 276 {cells}/uL (ref 15–500)
Eosinophils Relative: 3 %
HCT: 44.8 % (ref 38.5–50.0)
Hemoglobin: 15.3 g/dL (ref 13.0–17.0)
Lymphocytes Relative: 26 %
Lymphs Abs: 2392 {cells}/uL (ref 850–3900)
MCH: 28.5 pg (ref 27.0–33.0)
MCHC: 34.2 g/dL (ref 32.0–36.0)
MCV: 83.6 fL (ref 80.0–100.0)
MPV: 10 fL (ref 7.5–12.5)
Monocytes Absolute: 644 {cells}/uL (ref 200–950)
Monocytes Relative: 7 %
Neutro Abs: 5888 {cells}/uL (ref 1500–7800)
Neutrophils Relative %: 64 %
Platelets: 256 10*3/uL (ref 140–400)
RBC: 5.36 MIL/uL (ref 4.20–5.80)
RDW: 13.3 % (ref 11.0–15.0)
WBC: 9.2 10*3/uL (ref 3.8–10.8)

## 2015-07-05 MED ORDER — TRIAMCINOLONE ACETONIDE 0.1 % EX CREA: 1.0000 "application " | TOPICAL_CREAM | Freq: Two times a day (BID) | CUTANEOUS | Status: DC

## 2015-07-05 NOTE — Patient Instructions (Signed)
We will call with lab results Use the triamcinolone on your skin F/U 4 weeks for labs

## 2015-07-06 LAB — COMPREHENSIVE METABOLIC PANEL
ALK PHOS: 63 U/L (ref 40–115)
ALT: 16 U/L (ref 9–46)
AST: 16 U/L (ref 10–40)
Albumin: 4.9 g/dL (ref 3.6–5.1)
BILIRUBIN TOTAL: 0.7 mg/dL (ref 0.2–1.2)
BUN: 14 mg/dL (ref 7–25)
CO2: 23 mmol/L (ref 20–31)
CREATININE: 0.74 mg/dL (ref 0.60–1.35)
Calcium: 9.8 mg/dL (ref 8.6–10.3)
Chloride: 100 mmol/L (ref 98–110)
GLUCOSE: 72 mg/dL (ref 70–99)
Potassium: 3.7 mmol/L (ref 3.5–5.3)
Sodium: 135 mmol/L (ref 135–146)
Total Protein: 6.8 g/dL (ref 6.1–8.1)

## 2015-07-06 LAB — T4, FREE: Free T4: 1.4 ng/dL (ref 0.8–1.8)

## 2015-07-06 LAB — LIPID PANEL
Cholesterol: 141 mg/dL (ref 125–200)
HDL: 26 mg/dL — ABNORMAL LOW (ref >=40)
LDL Cholesterol: 81 mg/dL (ref <130)
Total CHOL/HDL Ratio: 5.4 Ratio — ABNORMAL HIGH (ref <=5.0)
Triglycerides: 168 mg/dL — ABNORMAL HIGH (ref <150)
VLDL: 34 mg/dL — ABNORMAL HIGH (ref <30)

## 2015-07-06 LAB — TSH: TSH: 4.25 m[IU]/L (ref 0.40–4.50)

## 2015-07-06 LAB — T3, FREE: T3 FREE: 4.4 pg/mL — AB (ref 2.3–4.2)

## 2015-07-07 ENCOUNTER — Encounter: Payer: Self-pay | Admitting: Family Medicine

## 2015-07-07 DIAGNOSIS — F39 Unspecified mood [affective] disorder: Secondary | ICD-10-CM | POA: Insufficient documentation

## 2015-07-07 DIAGNOSIS — L309 Dermatitis, unspecified: Secondary | ICD-10-CM | POA: Insufficient documentation

## 2015-07-07 NOTE — Assessment & Plan Note (Signed)
Continue to reitarate importance of compliance He agrees to taking meds for next month daily so we can see if his any of his symptoms are thyroid related or more mood related Etc Check labs Discussed healthy eating, more activity Also discussed looking into to school/job instead of just sitting around , he is convinveced he cant work because of his pars defect and chronic back pain

## 2015-07-07 NOTE — Assessment & Plan Note (Signed)
Underlying mood disorder, depression also noted. He declines medications at this time I want to see if thyroid pans out and get him on a routine and will try to broach medications again if symptoms still present He has mental illness in his family, his younger brother is on multiple meds His father is incarcerated His mother is struggling in many ways

## 2015-07-07 NOTE — Assessment & Plan Note (Signed)
Mild psorasis of scalp, more ezematous dry patches on face Given TAC cream

## 2015-07-07 NOTE — Progress Notes (Signed)
Patient ID: Mark Zuniga, male   DOB: 11/27/1995, 20 y.o.   MRN: [id number]    Subjective:    Patient ID: Mark Zuniga, male    DOB: 11/27/1995, 20 y.o.   MRN: [id number]  Patient presents for OTHER  Pt here to f/u, he has not been seen since June of 2016,he is non compliant with his thyroid medications which has been long standing issue with him.He takes it randomly. He does not work, he is not in school, sits at home. States because of his back pain he can't do anything. He is now self consious because of his current state of health and his weight gain. It makes him depressed at times, and also angry. He was on psychiatric meds in the past but states they kept switching him around and he sa no benefit.  He has wart on his foot still present from June  He has more dry red patches on face and in scalp using cortisone 1%    Review Of Systems:  GEN- denies fatigue, fever, weight loss,weakness, recent illness HEENT- denies eye drainage, change in vision, nasal discharge, CVS- denies chest pain, palpitations RESP- denies SOB, cough, wheeze ABD- denies N/V, change in stools, abd pain GU- denies dysuria, hematuria, dribbling, incontinence MSK- denies joint pain, muscle aches, injury Neuro- denies headache, dizziness, syncope, seizure activity       Objective:    BP 120/70 mmHg  Pulse 80  Temp(Src) 98 F (36.7 C) (Oral)  Resp 18  Wt 200 lb (90.719 kg) GEN- NAD, alert and oriented x3, odor HEENT- PERRL, EOMI, non injected sclera, pink conjunctiva, MMM, oropharynx clear Neck- Supple, no thyromegaly CVS- RRR, no murmur RESP-CTAB Psych- normal affect and mood, very pleasant and respectful Skin- erythematous eczema patches on face, more sliver flakley like scalp on front hair line  - Wart- right plantar  Pulses- Radial, DP- 2+  Procedure- Cryotherapy to plantar wart Procedure explained to patient questions answered benefits and risks discussed verbal consent  obtained. Antiseptic-none Liquid Nitrogen via spray gun 10 sec pass x 2 Patient tolerated procedure well Bandage applied       Assessment & Plan:      Problem List Items Addressed This Visit    Psoriasis of scalp    Mild psorasis of scalp, more ezematous dry patches on face Given TAC cream       Hypothyroidism - Primary    Continue to reitarate importance of compliance He agrees to taking meds for next month daily so we can see if his any of his symptoms are thyroid related or more mood related Etc Check labs Discussed healthy eating, more activity Also discussed looking into to school/job instead of just sitting around , he is convinveced he cant work because of his pars defect and chronic back pain      Relevant Orders   TSH (Completed)   T3, free (Completed)   T4, free (Completed)   Eczema of face    Other Visit Diagnoses    Weight gain        Relevant Orders    CBC with Differential/Platelet (Completed)    Comprehensive metabolic panel (Completed)    Lipid panel (Completed)    Warts        cryotherapy performed    Need for vaccination        Relevant Orders    Hepatitis A vaccine adult IM (Completed)    Meningococcal conjugate vaccine 4-valent IM (Completed)  Note: This dictation was prepared with Dragon dictation along with smaller phrase technology. Any transcriptional errors that result from this process are unintentional.

## 2015-07-19 ENCOUNTER — Ambulatory Visit: Payer: Medicaid Other | Admitting: Family Medicine

## 2015-07-23 ENCOUNTER — Ambulatory Visit (INDEPENDENT_AMBULATORY_CARE_PROVIDER_SITE_OTHER): Payer: Medicaid Other | Admitting: Family Medicine

## 2015-07-23 ENCOUNTER — Encounter: Payer: Self-pay | Admitting: Family Medicine

## 2015-07-23 VITALS — BP 126/70 | HR 88 | Temp 98.2°F | Resp 14 | Ht 71.0 in | Wt 189.0 lb

## 2015-07-23 DIAGNOSIS — L409 Psoriasis, unspecified: Secondary | ICD-10-CM

## 2015-07-23 DIAGNOSIS — B070 Plantar wart: Secondary | ICD-10-CM | POA: Diagnosis not present

## 2015-07-23 DIAGNOSIS — L309 Dermatitis, unspecified: Secondary | ICD-10-CM | POA: Diagnosis not present

## 2015-07-23 MED ORDER — FLUOCINONIDE 0.05 % EX SOLN
1.0000 | Freq: Two times a day (BID) | CUTANEOUS | Status: DC
Start: 2015-07-23 — End: 2015-12-28

## 2015-07-23 MED ORDER — CETIRIZINE HCL 10 MG PO TABS: 10.0000 mg | ORAL_TABLET | Freq: Every day | ORAL | Status: DC | PRN

## 2015-07-23 NOTE — Progress Notes (Signed)
Patient ID: Mark Zuniga, male   DOB: 07-25-1995, 20 y.o.   MRN: [id number]    Subjective:    Patient ID: Mark Zuniga, male    DOB: 07-25-1995, 20 y.o.   MRN: [id number]  Patient presents for Cryotherapy on Warts and Skin Plaques on face  Patient here for second cryotherapy on the wart on his left foot. Not have any problems with the initial cryotherapy  With regards to his psoriasis he feels like he is worsening patches in his scalp. He's been using over-the-counter shampoos head and shoulders T tree oil with minimal improvement. He does get some itching at the areas and a lot of flaking. He has noticed some improvement on his face with the use of triamcinolone but he now has some areas popping up in his eyebrows  He had a question whether or not he may have a gluten rash that he gets bumpy itchiness on his arms however he does not have any gluten sensitivity.  Review Of Systems:  GEN- denies fatigue, fever, weight loss,weakness, recent illness HEENT- denies eye drainage, change in vision, nasal discharge, CVS- denies chest pain, palpitations RESP- denies SOB, cough, wheeze ABD- denies N/V, change in stools, abd pain GU- denies dysuria, hematuria, dribbling, incontinence MSK- denies joint pain, muscle aches, injury Neuro- denies headache, dizziness, syncope, seizure activity       Objective:    BP 126/70 mmHg  Pulse 88  Temp(Src) 98.2 F (36.8 C) (Oral)  Resp 14  Ht 5\' 11"  (1.803 m)  Wt 189 lb (85.73 kg)  BMI 26.37 kg/m2 GEN- NAD, alert and oriented x3 Skin- face erythematous scaley plaqyes in bilat brows, erythematous macule on left cheek, multiple small psoriatric plaques and erythema scattered in scalp  Left foot- Plantar wart with black dots (vessels) seen  .Skin- erythematous eczema patches on face, more sliver flakley like scalp on front hair line  - Wart- right plantar  Pulses- Radial, DP- 2+  Procedure- Cryotherapy to plantar wart   Procedure  explained to patient questions answered benefits and risks discussed verbal consent obtained. Antiseptic-none Liquid Nitrogen via spray gun 10 sec pass x 2 Patient tolerated procedure well Bandage applied      Assessment & Plan:      Problem List Items Addressed This Visit    Psoriasis of scalp - Primary    Second round of cryotherapy done after scapel to debride   the top callus layer I will also try him on fluocinonide solution to his scalp for the psoriasis. He may also use a small amount and his brow region. If this does not improve then I will refer him to dermatology for further treatment      Eczema of face    Other Visit Diagnoses    Plantar wart           Note: This dictation was prepared with Dragon dictation along with smaller phrase technology. Any transcriptional errors that result from this process are unintentional.

## 2015-07-23 NOTE — Patient Instructions (Addendum)
Use new solution to scalp After T gel  If this does not help referral to dermatology Return on 5/5 for your thyroid labs  F/u 2 weeks for cryotherapy if needed

## 2015-07-23 NOTE — Assessment & Plan Note (Addendum)
Second round of cryotherapy done after scapel to debride   the top callus layer I will also try him on fluocinonide solution to his scalp for the psoriasis. He may also use a small amount and his brow region. If this does not improve then I will refer him to dermatology for further treatment

## 2015-08-02 ENCOUNTER — Other Ambulatory Visit: Payer: Self-pay | Admitting: Family Medicine

## 2015-08-02 ENCOUNTER — Other Ambulatory Visit: Payer: Medicaid Other

## 2015-08-09 ENCOUNTER — Telehealth: Payer: Self-pay | Admitting: Family Medicine

## 2015-08-09 ENCOUNTER — Other Ambulatory Visit: Payer: Medicaid Other

## 2015-08-09 DIAGNOSIS — E039 Hypothyroidism, unspecified: Secondary | ICD-10-CM

## 2015-08-09 LAB — TSH: TSH: 0.62 mIU/L (ref 0.40–4.50)

## 2015-08-09 LAB — T3, FREE: T3, Free: 4 pg/mL (ref 2.3–4.2)

## 2015-08-09 LAB — T4, FREE: Free T4: 1.6 ng/dL (ref 0.8–1.8)

## 2015-08-09 MED ORDER — LEVOTHYROXINE SODIUM 88 MCG PO TABS: 88.0000 ug | ORAL_TABLET | Freq: Every day | ORAL | Status: DC

## 2015-08-09 NOTE — Telephone Encounter (Signed)
[facility]  Needs refill on levothyroxine  [phone]

## 2015-08-09 NOTE — Telephone Encounter (Signed)
Refill appropriate and filled per protocol. 

## 2015-09-18 ENCOUNTER — Emergency Department (HOSPITAL_COMMUNITY)
Admission: EM | Admit: 2015-09-18 | Discharge: 2015-09-18 | Disposition: A | Discharge status: Home / Self Care | Payer: No Typology Code available for payment source | Attending: Emergency Medicine | Admitting: Emergency Medicine

## 2015-09-18 ENCOUNTER — Emergency Department (HOSPITAL_COMMUNITY): Payer: No Typology Code available for payment source

## 2015-09-18 ENCOUNTER — Encounter (HOSPITAL_COMMUNITY): Payer: Self-pay | Admitting: Emergency Medicine

## 2015-09-18 DIAGNOSIS — Y999 Unspecified external cause status: Secondary | ICD-10-CM | POA: Diagnosis not present

## 2015-09-18 DIAGNOSIS — Y939 Activity, unspecified: Secondary | ICD-10-CM | POA: Diagnosis not present

## 2015-09-18 DIAGNOSIS — S50311A Abrasion of right elbow, initial encounter: Secondary | ICD-10-CM | POA: Diagnosis not present

## 2015-09-18 DIAGNOSIS — R072 Precordial pain: Secondary | ICD-10-CM | POA: Insufficient documentation

## 2015-09-18 DIAGNOSIS — Z79899 Other long term (current) drug therapy: Secondary | ICD-10-CM | POA: Diagnosis not present

## 2015-09-18 DIAGNOSIS — Z87891 Personal history of nicotine dependence: Secondary | ICD-10-CM | POA: Insufficient documentation

## 2015-09-18 DIAGNOSIS — Y92410 Unspecified street and highway as the place of occurrence of the external cause: Secondary | ICD-10-CM | POA: Insufficient documentation

## 2015-09-18 DIAGNOSIS — S7011XA Contusion of right thigh, initial encounter: Secondary | ICD-10-CM | POA: Diagnosis not present

## 2015-09-18 DIAGNOSIS — R0789 Other chest pain: Secondary | ICD-10-CM

## 2015-09-18 DIAGNOSIS — S79921A Unspecified injury of right thigh, initial encounter: Secondary | ICD-10-CM | POA: Diagnosis present

## 2015-09-18 MED ORDER — IBUPROFEN 400 MG PO TABS
800.0000 mg | ORAL_TABLET | Freq: Once | ORAL | Status: AC
  Administered 2015-09-18: 800 mg via ORAL
  Filled 2015-09-18: qty 2

## 2015-09-18 MED ORDER — METHOCARBAMOL 500 MG PO TABS: 500.0000 mg | ORAL_TABLET | Freq: Two times a day (BID) | ORAL | Status: DC

## 2015-09-18 MED ORDER — IBUPROFEN 800 MG PO TABS: 800.0000 mg | ORAL_TABLET | Freq: Three times a day (TID) | ORAL | Status: DC
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
## 2015-09-25 ENCOUNTER — Ambulatory Visit: Payer: Medicaid Other | Admitting: Family Medicine

## 2015-09-30 ENCOUNTER — Ambulatory Visit (INDEPENDENT_AMBULATORY_CARE_PROVIDER_SITE_OTHER): Payer: Medicaid Other | Admitting: Family Medicine

## 2015-09-30 ENCOUNTER — Encounter: Payer: Self-pay | Admitting: Family Medicine

## 2015-09-30 DIAGNOSIS — M545 Low back pain, unspecified: Secondary | ICD-10-CM

## 2015-09-30 DIAGNOSIS — S20212D Contusion of left front wall of thorax, subsequent encounter: Secondary | ICD-10-CM | POA: Diagnosis not present

## 2015-09-30 DIAGNOSIS — G8929 Other chronic pain: Secondary | ICD-10-CM | POA: Diagnosis not present

## 2015-09-30 MED ORDER — HYDROCODONE-ACETAMINOPHEN 5-325 MG PO TABS: 1.0000 | ORAL_TABLET | Freq: Four times a day (QID) | ORAL | Status: DC | PRN

## 2015-09-30 MED ORDER — METHOCARBAMOL 500 MG PO TABS: 500.0000 mg | ORAL_TABLET | Freq: Three times a day (TID) | ORAL | Status: DC | PRN

## 2015-09-30 NOTE — Patient Instructions (Signed)
Continue Ice Use pain medicine and muscle relaxer  F/U as previous
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
## 2015-11-07 ENCOUNTER — Encounter: Payer: Self-pay | Admitting: Family Medicine

## 2015-12-13 ENCOUNTER — Encounter: Payer: Self-pay | Admitting: Family Medicine

## 2015-12-28 ENCOUNTER — Encounter (HOSPITAL_COMMUNITY): Payer: Self-pay | Admitting: Emergency Medicine

## 2015-12-28 ENCOUNTER — Emergency Department (HOSPITAL_COMMUNITY)
Admission: EM | Admit: 2015-12-28 | Discharge: 2015-12-28 | Disposition: A | Discharge status: Home / Self Care | Payer: Medicaid Other | Attending: Emergency Medicine | Admitting: Emergency Medicine

## 2015-12-28 DIAGNOSIS — Y939 Activity, unspecified: Secondary | ICD-10-CM | POA: Insufficient documentation

## 2015-12-28 DIAGNOSIS — R112 Nausea with vomiting, unspecified: Secondary | ICD-10-CM | POA: Insufficient documentation

## 2015-12-28 DIAGNOSIS — F17210 Nicotine dependence, cigarettes, uncomplicated: Secondary | ICD-10-CM | POA: Diagnosis not present

## 2015-12-28 DIAGNOSIS — Y999 Unspecified external cause status: Secondary | ICD-10-CM | POA: Insufficient documentation

## 2015-12-28 DIAGNOSIS — R55 Syncope and collapse: Secondary | ICD-10-CM

## 2015-12-28 DIAGNOSIS — Y92002 Bathroom of unspecified non-institutional (private) residence single-family (private) house as the place of occurrence of the external cause: Secondary | ICD-10-CM | POA: Diagnosis not present

## 2015-12-28 DIAGNOSIS — W2209XA Striking against other stationary object, initial encounter: Secondary | ICD-10-CM | POA: Insufficient documentation

## 2015-12-28 DIAGNOSIS — E039 Hypothyroidism, unspecified: Secondary | ICD-10-CM | POA: Diagnosis not present

## 2015-12-28 LAB — URINE MICROSCOPIC-ADD ON: WBC, UA: NONE SEEN WBC/hpf (ref 0–5)

## 2015-12-28 LAB — URINALYSIS, ROUTINE W REFLEX MICROSCOPIC
Bilirubin Urine: NEGATIVE
Glucose, UA: NEGATIVE mg/dL
Ketones, ur: NEGATIVE mg/dL
Leukocytes, UA: NEGATIVE
Nitrite: NEGATIVE
Protein, ur: NEGATIVE mg/dL
Specific Gravity, Urine: 1.013 (ref 1.005–1.030)
pH: 6 (ref 5.0–8.0)

## 2015-12-28 LAB — ETHANOL: Alcohol, Ethyl (B): <5 mg/dL (ref <5)

## 2015-12-28 LAB — CBC
HCT: 43.3 % (ref 39.0–52.0)
Hemoglobin: 14.9 g/dL (ref 13.0–17.0)
MCH: 28.5 pg (ref 26.0–34.0)
MCHC: 34.4 g/dL (ref 30.0–36.0)
MCV: 82.8 fL (ref 78.0–100.0)
Platelets: 263 K/uL (ref 150–400)
RBC: 5.23 MIL/uL (ref 4.22–5.81)
RDW: 13 % (ref 11.5–15.5)
WBC: 12.3 K/uL — ABNORMAL HIGH (ref 4.0–10.5)

## 2015-12-28 LAB — BASIC METABOLIC PANEL WITH GFR
Anion gap: 10 (ref 5–15)
BUN: 9 mg/dL (ref 6–20)
CO2: 25 mmol/L (ref 22–32)
Calcium: 9.7 mg/dL (ref 8.9–10.3)
Chloride: 102 mmol/L (ref 101–111)
Creatinine, Ser: 0.85 mg/dL (ref 0.61–1.24)
GFR calc Af Amer: >60 mL/min
GFR calc non Af Amer: >60 mL/min
Glucose, Bld: 148 mg/dL — ABNORMAL HIGH (ref 65–99)
Potassium: 3.7 mmol/L (ref 3.5–5.1)
Sodium: 137 mmol/L (ref 135–145)

## 2015-12-28 LAB — I-STAT TROPONIN, ED
Troponin i, poc: 0 ng/mL (ref 0.00–0.08)
Troponin i, poc: 0 ng/mL (ref 0.00–0.08)

## 2015-12-28 MED ORDER — ONDANSETRON HCL 4 MG/2ML IJ SOLN
4.0000 mg | Freq: Once | INTRAMUSCULAR | Status: AC
  Administered 2015-12-28: 4 mg via INTRAVENOUS
  Filled 2015-12-28: qty 2

## 2015-12-28 MED ORDER — SODIUM CHLORIDE 0.9 % IV BOLUS (SEPSIS)
1000.0000 mL | Freq: Once | INTRAVENOUS | Status: AC
  Administered 2015-12-28: 1000 mL via INTRAVENOUS

## 2015-12-28 MED ORDER — ONDANSETRON 4 MG PO TBDP: 4.0000 mg | ORAL_TABLET | Freq: Three times a day (TID) | ORAL | 0 refills | Status: DC | PRN

## 2015-12-28 NOTE — ED Provider Notes (Signed)
[facility]-EMERGENCY DEPT Provider Note   CSN: [id number] Arrival date & time: 12/28/15  0203  By signing my name below, I, Clovis Pu, attest that this documentation has been prepared under the direction and in the presence of Rolland Porter, MD  Electronically Signed: Clovis Pu, ED Scribe. 12/28/15. 3:49 AM.   History   Chief Complaint Chief Complaint  Patient presents with  . Emesis    The history is provided by the patient and a parent. No language interpreter was used.   HPI Comments:  Mark Zuniga is a 20 y.o. male, with a hx of hypothyroidism, who presents to the Emergency Department complaining of moderate headaches post a syncopal episode earlier in the PM today. He notes he began to feel nauseous and dizzy before syncope. Pt notes he fell backwards and hit his head on the washing machine.  Mother notes pt was laying between the door and dryer in the bathroom when she found him. Pt states he began drinking around 11PM yesterday night and notes he had about 2-3 beers.  Pt notes these symptoms occur ~1 time every month. No alleviating factors noted. Pt denies any other complaints at this time.    Past Medical History:  Diagnosis Date  . Migraine   . Thyroid disease     Patient Active Problem List   Diagnosis Date Noted  . 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 Mood disorder (HCC) 07/07/2015  . Rash and nonspecific skin eruption 09/05/2014  . 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 Migraines 01/22/2014  . Routine general medical examination at a health care facility 01/22/2014  . Syncope 11/14/2013  . Headache 11/14/2013  . 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 Dermatitis 03/14/2013  . Hypothyroidism 03/14/2013  . Back pain 07/26/2012  . Radiculopathy of lumbar region 07/26/2012    History reviewed. No pertinent surgical history.     Home Medications    Prior to Admission medications   Medication Sig Start Date End Date Taking? Authorizing Provider  ibuprofen  (ADVIL,MOTRIN) 800 MG tablet Take 1 tablet (800 mg total) by mouth 3 (three) times daily. Patient taking differently: Take 400-800 mg by mouth every 8 (eight) hours as needed for moderate pain.  09/18/15  Yes Fayrene Helper, PA-C  levothyroxine (SYNTHROID, LEVOTHROID) 88 MCG tablet Take 1 tablet (88 mcg total) by mouth daily. 08/09/15  Yes Salley Scarlet, MD  ondansetron (ZOFRAN ODT) 4 MG disintegrating tablet Take 1 tablet (4 mg total) by mouth every 8 (eight) hours as needed for nausea. 12/28/15   Rolland Porter, MD    Family History No family history on file.  Social History Social History  Substance Use Topics  . Smoking status: Current Every Day Smoker    Packs/day: 0.75    Types: Cigarettes  . Smokeless tobacco: Former Neurosurgeon     Comment: quit 8/14/21015  . Alcohol use 0.0 oz/week     Allergies   Sulfa drugs cross reactors   Review of Systems Review of Systems  Constitutional: Negative for appetite change, chills, diaphoresis, fatigue and fever.  HENT: Negative for mouth sores, sore throat and trouble swallowing.   Eyes: Negative for visual disturbance.  Respiratory: Negative for cough, chest tightness, shortness of breath and wheezing.   Cardiovascular: Negative for chest pain.  Gastrointestinal: Negative for abdominal distention, abdominal pain, diarrhea, nausea and vomiting.  Endocrine: Negative for polydipsia, polyphagia and polyuria.  Genitourinary: Negative for dysuria, frequency and hematuria.  Musculoskeletal: Negative for gait problem.  Skin: Negative  for color change, pallor and rash.  Neurological: Positive for dizziness and syncope. Negative for light-headedness and headaches.  Hematological: Does not bruise/bleed easily.  Psychiatric/Behavioral: Negative for behavioral problems and confusion.     Physical Exam Updated Vital Signs BP 105/59   Pulse 65   Temp 97.8 F (36.6 C) (Oral)   Resp 16   SpO2 96%   Physical Exam  Constitutional: He is oriented to  person, place, and time. He appears well-developed and well-nourished. No distress.  HENT:  Head: Normocephalic.  Eyes: Conjunctivae are normal. Pupils are equal, round, and reactive to light. No scleral icterus.  Neck: Normal range of motion. Neck supple. No thyromegaly present.  Cardiovascular: Normal rate and regular rhythm.  Exam reveals no gallop and no friction rub.   No murmur heard. Pulmonary/Chest: Effort normal and breath sounds normal. No respiratory distress. He has no wheezes. He has no rales.  Abdominal: Soft. Bowel sounds are normal. He exhibits no distension. There is no tenderness. There is no rebound.  Musculoskeletal: Normal range of motion.  Neurological: He is alert and oriented to person, place, and time.  Skin: Skin is warm and dry. No rash noted.  Psychiatric: He has a normal mood and affect. His behavior is normal.     ED Treatments / Results  DIAGNOSTIC STUDIES:  Oxygen Saturation is 100% on RA, normal by my interpretation.    COORDINATION OF CARE:  3:48 AM Discussed treatment plan with pt at bedside and pt agreed to plan.  Labs (all labs ordered are listed, but only abnormal results are displayed) Labs Reviewed  BASIC METABOLIC PANEL - Abnormal; Notable for the following:       Result Value   Glucose, Bld 148 (*)    All other components within normal limits  CBC - Abnormal; Notable for the following:    WBC 12.3 (*)    All other components within normal limits  URINALYSIS, ROUTINE W REFLEX MICROSCOPIC (NOT [facility]) - Abnormal; Notable for the following:    Hgb urine dipstick SMALL (*)    All other components within normal limits  URINE MICROSCOPIC-ADD ON - Abnormal; Notable for the following:    Squamous Epithelial / LPF 0-5 (*)    Bacteria, UA RARE (*)    Casts GRANULAR CAST (*)    All other components within normal limits  ETHANOL  I-STAT TROPOININ, ED  I-STAT TROPOININ, ED    EKG  EKG Interpretation  Date/Time:  Saturday December 28 2015 02:36:14 EDT Ventricular Rate:  58 PR Interval:  148 QRS Duration: 114 QT Interval:  398 QTC Calculation: 390 R Axis:   78 Text Interpretation:  Sinus bradycardia Early repolarization Otherwise normal ECG Confirmed by Fayrene FearingJAMES  MD, Agostino Gorin ([id number]) on 12/28/2015 2:52:05 AM       Radiology No results found.  Procedures Procedures (including critical care time)  Medications Ordered in ED Medications  ondansetron (ZOFRAN) injection 4 mg (4 mg Intravenous Given 12/28/15 0341)  sodium chloride 0.9 % bolus 1,000 mL (0 mLs Intravenous Stopped 12/28/15 0420)     Initial Impression / Assessment and Plan / ED Course  I have reviewed the triage vital signs and the nursing notes.  Pertinent labs & imaging results that were available during my care of the patient were reviewed by me and considered in my medical decision making (see chart for details).  Clinical Course    EKG with J-point elevation. Normal troponin upon arrival and delta. Reassuring electrolytes studies. Symptoms are  consistent with a vagal syncope. No seizure activity. Appropriate for discharge home.  Final Clinical Impressions(s) / ED Diagnoses   Final diagnoses:  Vasovagal syncope  Non-intractable vomiting with nausea, vomiting of unspecified type    New Prescriptions New Prescriptions   ONDANSETRON (ZOFRAN ODT) 4 MG DISINTEGRATING TABLET    Take 1 tablet (4 mg total) by mouth every 8 (eight) hours as needed for nausea.  I personally performed the services described in this documentation, which was scribed in my presence. The recorded information has been reviewed and is accurate.     Rolland Porter, MD 12/28/15 [phone]

## 2015-12-28 NOTE — ED Triage Notes (Signed)
Pt to ED via [facility]>  Reports drinking etoh and smoking marijuana tonight.  EMS called out for syncopal episode and vomiting.  Pt states he started feeling better and didn't come to hospital at that time.  EMS called back out for another episode of vomiting.

## 2016-06-24 ENCOUNTER — Other Ambulatory Visit: Payer: Self-pay | Admitting: Family Medicine

## 2017-05-24 ENCOUNTER — Emergency Department
Admission: EM | Admit: 2017-05-24 | Discharge: 2017-05-24 | Discharge status: Absent without leave | Payer: Self-pay | Source: Home / Self Care

## 2017-06-26 ENCOUNTER — Emergency Department (HOSPITAL_COMMUNITY)
Admission: EM | Admit: 2017-06-26 | Discharge: 2017-06-26 | Disposition: A | Discharge status: Home / Self Care | Payer: Medicaid Other | Attending: Emergency Medicine | Admitting: Emergency Medicine

## 2017-06-26 ENCOUNTER — Other Ambulatory Visit: Payer: Self-pay

## 2017-06-26 ENCOUNTER — Encounter (HOSPITAL_COMMUNITY): Payer: Self-pay | Admitting: Emergency Medicine

## 2017-06-26 ENCOUNTER — Other Ambulatory Visit: Payer: Self-pay | Admitting: Family Medicine

## 2017-06-26 DIAGNOSIS — Z5321 Procedure and treatment not carried out due to patient leaving prior to being seen by health care provider: Secondary | ICD-10-CM | POA: Diagnosis not present

## 2017-06-26 DIAGNOSIS — R51 Headache: Secondary | ICD-10-CM | POA: Diagnosis not present

## 2017-06-26 LAB — CBG MONITORING, ED: Glucose-Capillary: 104 mg/dL — ABNORMAL HIGH (ref 65–99)

## 2017-06-26 MED ORDER — OXYCODONE-ACETAMINOPHEN 5-325 MG PO TABS
1.0000 | ORAL_TABLET | ORAL | Status: DC | PRN
  Administered 2017-06-26: 1 via ORAL
  Filled 2017-06-26: qty 1

## 2017-06-26 NOTE — ED Notes (Signed)
Checked CBG 104, RN Marisela informed

## 2017-06-26 NOTE — ED Triage Notes (Signed)
The patient said he started having a migraine this morning.  He said he has also had nausea, vomiting and wanting to pass out.  He said it is worse than a migraine.  He said he took an ibuprofen but it did not help.  He also said he is borderline diabetic and does not know if it has something to do with that.  He rates his pain 7/10.  He has been seen here for the migraines in the past.

## 2017-06-26 NOTE — ED Notes (Addendum)
Pt left without being seen.

## 2017-06-28 ENCOUNTER — Ambulatory Visit (HOSPITAL_COMMUNITY)
Admission: EM | Admit: 2017-06-28 | Discharge: 2017-06-28 | Disposition: A | Discharge status: Home / Self Care | Payer: Medicaid Other | Attending: Urgent Care | Admitting: Urgent Care

## 2017-06-28 ENCOUNTER — Encounter (HOSPITAL_COMMUNITY): Payer: Self-pay

## 2017-06-28 ENCOUNTER — Other Ambulatory Visit: Payer: Self-pay

## 2017-06-28 DIAGNOSIS — G44209 Tension-type headache, unspecified, not intractable: Secondary | ICD-10-CM

## 2017-06-28 DIAGNOSIS — Z8669 Personal history of other diseases of the nervous system and sense organs: Secondary | ICD-10-CM

## 2017-06-28 DIAGNOSIS — R110 Nausea: Secondary | ICD-10-CM

## 2017-06-28 MED ORDER — MELOXICAM 7.5 MG PO TABS: 7.5000 mg | ORAL_TABLET | Freq: Every day | ORAL | 1 refills | Status: DC

## 2017-06-28 MED ORDER — ONDANSETRON 8 MG PO TBDP: 8.0000 mg | ORAL_TABLET | Freq: Three times a day (TID) | ORAL | 0 refills | Status: DC | PRN

## 2017-06-28 NOTE — ED Triage Notes (Signed)
Pt presents with complaints of having a headache x 2 days off and on. Report associated nausea and dizziness. States this is worse than a migraine.

## 2017-06-28 NOTE — ED Provider Notes (Signed)
  MRN: [id number] DOB: November 16, 1995  Subjective:   Ardeen Jourdainicholas C Pelot is a 22 y.o. male presenting for 2 day history of intermittent migraine headaches associated with nausea, malaise, dizziness. Headaches have been frontal in general, blurred vision. He went to the ER, received Percocet per patient but left without being seen. Has a history of migraines, managed with ibuprofen. States that Percocet helped much more than ibuprofen. Does not hydrate well generally, has tried to do better in the past 2 days. Sleep is erratic. Just started taking levothyroxine in the past 2 days as well. Does not drink alcohol. Smokes marijuana once a month. Denies smoking cigarettes. Denies drinking coffee and caffeine drinks. Denies confusion, sore throat, cough, chest pain, abdominal pain, dysuria, urinary frequency. He admits a history of migraines but states they are infrequent and not typical migraine headaches. Denies active headache in clinic.  No current facility-administered medications for this encounter.   Current Outpatient Medications:  .  levothyroxine (SYNTHROID, LEVOTHROID) 88 MCG tablet, TAKE 1 TABLET BY MOUTH EVERY DAY, Disp: 30 tablet, Rfl: 3 .  ondansetron (ZOFRAN ODT) 4 MG disintegrating tablet, Take 1 tablet (4 mg total) by mouth every 8 (eight) hours as needed for nausea., Disp: 6 tablet, Rfl: 0   Allergies  Allergen Reactions  . Sulfa Drugs Cross Reactors Other (See Comments)    unknown    Past Medical History:  Diagnosis Date  . Migraine   . Thyroid disease      History reviewed. No pertinent surgical history.  Objective:   Vitals: BP 124/78   Pulse 86   Temp 98 F (36.7 C)   Resp 16   Ht 5\' 11"  (1.803 m)   Wt 192 lb (87.1 kg)   SpO2 100%   BMI 26.78 kg/m   Physical Exam  Constitutional: He is oriented to person, place, and time. He appears well-developed and well-nourished.  HENT:  Mouth/Throat: Oropharynx is clear and moist.  Eyes: Pupils are equal, round, and reactive  to light. EOM are normal.  Cardiovascular: Normal rate, regular rhythm and intact distal pulses. Exam reveals no gallop and no friction rub.  No murmur heard. Pulmonary/Chest: No respiratory distress. He has no wheezes. He has no rales.  Musculoskeletal: He exhibits no edema.  Neurological: He is alert and oriented to person, place, and time. He displays normal reflexes. No cranial nerve deficit. Coordination normal.  Skin: Skin is warm and dry.  Psychiatric: He has a normal mood and affect.   Assessment and Plan :   Acute non intractable tension-type headache  Nausea  History of migraine  Physical exam findings very reassuring. Counseled patient on headache management. Will use meloxicam, Zofran. ER and return-to-clinic precautions discussed, patient verbalized understanding.    Wallis BambergMani, Slayden Mennenga, [place] JerseyPA-C 06/28/17 1840

## 2017-06-29 ENCOUNTER — Encounter: Payer: Self-pay | Admitting: Family Medicine

## 2017-06-29 ENCOUNTER — Ambulatory Visit (INDEPENDENT_AMBULATORY_CARE_PROVIDER_SITE_OTHER): Payer: Medicaid Other | Admitting: Family Medicine

## 2017-06-29 ENCOUNTER — Other Ambulatory Visit: Payer: Self-pay

## 2017-06-29 VITALS — BP 134/82 | HR 78 | Temp 97.9°F | Resp 14 | Ht 71.0 in | Wt 219.0 lb

## 2017-06-29 DIAGNOSIS — G43709 Chronic migraine without aura, not intractable, without status migrainosus: Secondary | ICD-10-CM | POA: Diagnosis not present

## 2017-06-29 DIAGNOSIS — Z6830 Body mass index (BMI) 30.0-30.9, adult: Secondary | ICD-10-CM | POA: Diagnosis not present

## 2017-06-29 DIAGNOSIS — Z833 Family history of diabetes mellitus: Secondary | ICD-10-CM | POA: Diagnosis not present

## 2017-06-29 DIAGNOSIS — L409 Psoriasis, unspecified: Secondary | ICD-10-CM

## 2017-06-29 DIAGNOSIS — E6609 Other obesity due to excess calories: Secondary | ICD-10-CM

## 2017-06-29 DIAGNOSIS — M4306 Spondylolysis, lumbar region: Secondary | ICD-10-CM

## 2017-06-29 DIAGNOSIS — E039 Hypothyroidism, unspecified: Secondary | ICD-10-CM

## 2017-06-29 DIAGNOSIS — Z13220 Encounter for screening for lipoid disorders: Secondary | ICD-10-CM | POA: Diagnosis not present

## 2017-06-29 MED ORDER — TRIAMCINOLONE ACETONIDE 0.1 % EX CREA: 1.0000 "application " | TOPICAL_CREAM | Freq: Two times a day (BID) | CUTANEOUS | 1 refills | Status: DC

## 2017-06-29 NOTE — Assessment & Plan Note (Signed)
Will check all his labs first, Will need to get thyroid under control He can use the mobic and zofran as needed

## 2017-06-29 NOTE — Assessment & Plan Note (Signed)
Given Triamcinolone for spot treatment

## 2017-06-29 NOTE — Assessment & Plan Note (Signed)
TFT, I had a conversation with him and his mother about Following up and compliance with medications.  He is sitting around all day not active he is not in school no part-time work.  Seems to want to try to improve things hopefully he will stick to his medical plan.  Also discussed his dietary habits and the weight gain which also makes his chronic back pain worse which he is actually on disability for.

## 2017-06-29 NOTE — Progress Notes (Signed)
Subjective:    Patient ID: Mark Zuniga, male    DOB: 04/08/1995, 22 y.o.   MRN: [id number]  Patient presents for Frequent HA (increased HA, nausea- has been seen at [facility])  Pt here with frequent headaches does have history of migraines.  However he has not been to the office since April 2017. History of hypothyroidism not been on his thyroid medication what looks like about a year. In the past he was on Inderal for his migraines as well as ibuprofen  He was seen in the emergency room yesterday secondary to 2 days of migraine with nausea dizziness.  States that he had been in the ER on March 30 as well he received a Percocet but then left without being seen.  He states that his sleep has been erratic he does smoke some marijuana per the notes. He was given a prescription for meloxicam and Zofran at his visit on 4/ 1 in the emergency room Today he does not have any headache, he thinks all his problems have come from him gaining weight and not taking his meds He just sleeps, watches TV, plays games all day    Review Of Systems:  GEN- denies fatigue, fever, weight loss,weakness, recent illness HEENT- denies eye drainage, change in vision, nasal discharge, CVS- denies chest pain, palpitations RESP- denies SOB, cough, wheeze ABD- denies N/V, change in stools, abd pain GU- denies dysuria, hematuria, dribbling, incontinence MSK- denies joint pain, muscle aches, injury Neuro- denies headache, dizziness, syncope, seizure activity       Objective:    BP 134/82   Pulse 78   Temp 97.9 F (36.6 C) (Oral)   Resp 14   Ht 5\' 11"  (1.803 m)   Wt 219 lb (99.3 kg)   SpO2 97%   BMI 30.54 kg/m  GEN- NAD, alert and oriented x3,obese  HEENT- PERRL, EOMI, non injected sclera, pink conjunctiva, MMM, oropharynx clear Neck- Supple, + thyromegaly CVS- RRR, no murmur Skin- psoratic lesions on face and right foot  EXT- No edema Pulses- Radial, DP- 2+        Assessment & Plan:       Problem List Items Addressed This Visit      Unprioritized   Pars defect of lumbar spine   Psoriasis    Given Triamcinolone for spot treatment      Migraines    Will check all his labs first, Will need to get thyroid under control He can use the mobic and zofran as needed       Relevant Medications   meloxicam (MOBIC) 15 MG tablet   Other Relevant Orders   CBC with Differential/Platelet   Hypothyroidism - Primary    TFT, I had a conversation with him and his mother about Following up and compliance with medications.  He is sitting around all day not active he is not in school no part-time work.  Seems to want to try to improve things hopefully he will stick to his medical plan.  Also discussed his dietary habits and the weight gain which also makes his chronic back pain worse which he is actually on disability for.      Relevant Orders   CBC with Differential/Platelet   TSH   T3, free   T4, free    Other Visit Diagnoses    Class 1 obesity due to excess calories with serious comorbidity and body mass index (BMI) of 30.0 to 30.9 in adult       Relevant  Orders   Comprehensive metabolic panel   CBC with Differential/Platelet   Lipid panel   Hemoglobin A1c   Screening cholesterol level       Relevant Orders   Comprehensive metabolic panel   Family history of diabetes mellitus       Relevant Orders   Hemoglobin A1c      Note: This dictation was prepared with Dragon dictation along with smaller phrase technology. Any transcriptional errors that result from this process are unintentional.

## 2017-06-30 LAB — COMPREHENSIVE METABOLIC PANEL
AG Ratio: 2.4 (calc) (ref 1.0–2.5)
ALKALINE PHOSPHATASE (APISO): 80 U/L (ref 40–115)
ALT: 28 U/L (ref 9–46)
AST: 17 U/L (ref 10–40)
Albumin: 4.8 g/dL (ref 3.6–5.1)
BUN: 13 mg/dL (ref 7–25)
CO2: 23 mmol/L (ref 20–32)
CREATININE: 0.84 mg/dL (ref 0.60–1.35)
Calcium: 9.6 mg/dL (ref 8.6–10.3)
Chloride: 107 mmol/L (ref 98–110)
GLUCOSE: 101 mg/dL — AB (ref 65–99)
Globulin: 2 g/dL (calc) (ref 1.9–3.7)
Potassium: 4 mmol/L (ref 3.5–5.3)
Sodium: 139 mmol/L (ref 135–146)
Total Bilirubin: 0.5 mg/dL (ref 0.2–1.2)
Total Protein: 6.8 g/dL (ref 6.1–8.1)

## 2017-06-30 LAB — CBC WITH DIFFERENTIAL/PLATELET
BASOS PCT: 0.7 %
Basophils Absolute: 69 cells/uL (ref 0–200)
EOS ABS: 594 {cells}/uL — AB (ref 15–500)
Eosinophils Relative: 6 %
HEMATOCRIT: 44.5 % (ref 38.5–50.0)
HEMOGLOBIN: 15.4 g/dL (ref 13.2–17.1)
LYMPHS ABS: 2495 {cells}/uL (ref 850–3900)
MCH: 28.1 pg (ref 27.0–33.0)
MCHC: 34.6 g/dL (ref 32.0–36.0)
MCV: 81.2 fL (ref 80.0–100.0)
MPV: 10.3 fL (ref 7.5–12.5)
Monocytes Relative: 6.9 %
NEUTROS ABS: 6059 {cells}/uL (ref 1500–7800)
Neutrophils Relative %: 61.2 %
Platelets: 328 10*3/uL (ref 140–400)
RBC: 5.48 10*6/uL (ref 4.20–5.80)
RDW: 12.8 % (ref 11.0–15.0)
Total Lymphocyte: 25.2 %
WBC: 9.9 10*3/uL (ref 3.8–10.8)
WBCMIX: 683 {cells}/uL (ref 200–950)

## 2017-06-30 LAB — HEMOGLOBIN A1C
EAG (MMOL/L): 5.7 (calc)
Hgb A1c MFr Bld: 5.2 % of total Hgb (ref <5.7)
Mean Plasma Glucose: 103 (calc)

## 2017-06-30 LAB — T3, FREE: T3, Free: 3.8 pg/mL (ref 2.3–4.2)

## 2017-06-30 LAB — LIPID PANEL
CHOL/HDL RATIO: 4.7 (calc) (ref <5.0)
Cholesterol: 145 mg/dL (ref <200)
HDL: 31 mg/dL — ABNORMAL LOW (ref >40)
LDL CHOLESTEROL (CALC): 94 mg/dL
Non-HDL Cholesterol (Calc): 114 mg/dL (calc) (ref <130)
TRIGLYCERIDES: 107 mg/dL (ref <150)

## 2017-06-30 LAB — T4, FREE: Free T4: 1.4 ng/dL (ref 0.8–1.8)

## 2017-06-30 LAB — TSH: TSH: 3.68 m[IU]/L (ref 0.40–4.50)

## 2017-07-01 ENCOUNTER — Telehealth: Payer: Self-pay | Admitting: Family Medicine

## 2017-07-01 ENCOUNTER — Encounter: Payer: Self-pay | Admitting: *Deleted

## 2017-07-01 ENCOUNTER — Other Ambulatory Visit: Payer: Self-pay | Admitting: *Deleted

## 2017-07-01 MED ORDER — LEVOTHYROXINE SODIUM 88 MCG PO TABS: 88.0000 ug | ORAL_TABLET | Freq: Every day | ORAL | 3 refills | Status: DC

## 2017-07-01 NOTE — Telephone Encounter (Signed)
Prescription sent to pharmacy.

## 2017-07-01 NOTE — Telephone Encounter (Signed)
[facility]

## 2017-08-17 ENCOUNTER — Encounter: Payer: Self-pay | Admitting: Family Medicine

## 2017-08-17 ENCOUNTER — Ambulatory Visit (INDEPENDENT_AMBULATORY_CARE_PROVIDER_SITE_OTHER): Payer: Medicaid Other | Admitting: Family Medicine

## 2017-08-17 ENCOUNTER — Other Ambulatory Visit: Payer: Self-pay

## 2017-08-17 VITALS — BP 132/86 | HR 97 | Temp 98.6°F | Resp 16 | Ht 71.0 in | Wt 218.2 lb

## 2017-08-17 DIAGNOSIS — J069 Acute upper respiratory infection, unspecified: Secondary | ICD-10-CM | POA: Diagnosis not present

## 2017-08-17 DIAGNOSIS — H6122 Impacted cerumen, left ear: Secondary | ICD-10-CM

## 2017-08-17 DIAGNOSIS — J029 Acute pharyngitis, unspecified: Secondary | ICD-10-CM

## 2017-08-17 DIAGNOSIS — H60502 Unspecified acute noninfective otitis externa, left ear: Secondary | ICD-10-CM

## 2017-08-17 DIAGNOSIS — J309 Allergic rhinitis, unspecified: Secondary | ICD-10-CM

## 2017-08-17 MED ORDER — MOMETASONE FUROATE 50 MCG/ACT NA SUSP: 2.0000 | Freq: Every day | NASAL | 12 refills | Status: DC

## 2017-08-17 MED ORDER — NEOMYCIN-POLYMYXIN-HC 3.5-10000-1 OT SOLN: 4.0000 [drp] | Freq: Three times a day (TID) | OTIC | 0 refills | Status: AC

## 2017-08-17 MED ORDER — BENZONATATE 100 MG PO CAPS: 100.0000 mg | ORAL_CAPSULE | Freq: Three times a day (TID) | ORAL | 0 refills | Status: DC | PRN

## 2017-08-17 NOTE — Progress Notes (Signed)
Patient ID: Mark Zuniga, male    DOB: 09/18/1995, 22 y.o.   MRN: [id number]  PCP: Salley Scarlet, MD  Chief Complaint  Patient presents with  . Cough    symptoms x 2days   . Sore Throat  . Nasal Congestion    Subjective:   Mark Zuniga is a 22 y.o. male, presents to clinic with CC of 3 days of sore throat, he looked with a flashlight and he saw white spots and "puss pockets".  ST is described as "sore", burning constant, severe pain, worse with swallowing, no improvement with cough/sore throat drops, no other meds or tx attempted.  No other alleviating or aggravating factors.  He had associated mild headache, he did take a nap yesterday but today does not feel extremely fatigued and denies weakness.  He has complains of 2 days of runny nose, congestion with sneezing, states he's probably used 100 tissues, knowing his nose.  He has a past medical history of seasonal allergies and eczema, he used to use Benadryl as needed, he has not tried Benadryl in the past 3 days.  He has sick contacts at home, mother had a cold and a cough 1 week ago, brother is ill with cough.  Patient denies any fever, sweats, chest tightness, wheeze, shortness of breath.  A history of vasovagal syncopal episodes, states he has not had any syncope, palpitations with his coughing.  Denies any chest pain, back pain, abdominal pain, nausea, vomiting, diarrhea He also has left ear pain with decreased hearing, he has a lot of wax in the ear.  He states that is been tender when he uses Q-tips.  Denies any drainage from his ear, redness or swelling of his external ear or around it.   Patient Active Problem List   Diagnosis Date Noted  . 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 Mood disorder (HCC) 07/07/2015  . Rash and nonspecific skin eruption 09/05/2014  . 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 Migraines 01/22/2014  . Routine general medical examination at a health care facility 01/22/2014  . Syncope 11/14/2013    . Headache 11/14/2013  . Psoriasis 06/27/2013  . Dermatitis 03/14/2013  . Hypothyroidism 03/14/2013  . Back pain 07/26/2012  . Radiculopathy of lumbar region 07/26/2012     Prior to Admission medications   Medication Sig Start Date End Date Taking? Authorizing Provider  levothyroxine (SYNTHROID, LEVOTHROID) 88 MCG tablet Take 1 tablet (88 mcg total) by mouth daily. 07/01/17  Yes [place], Velna Hatchet, MD  meloxicam (MOBIC) 15 MG tablet Take 15 mg by mouth daily.   Yes [provider]  ondansetron (ZOFRAN-ODT) 8 MG disintegrating tablet Take 1 tablet (8 mg total) by mouth every 8 (eight) hours as needed for nausea. 06/28/17  Yes Wallis Bamberg, PA-C  triamcinolone cream (KENALOG) 0.1 % Apply 1 application topically 2 (two) times daily. 06/29/17  Yes [place], Velna Hatchet, MD     Allergies  Allergen Reactions  . Sulfa Drugs Cross Reactors Other (See Comments)    unknown     No family history on file.   Social History   Socioeconomic History  . Marital status: Single    Spouse name: Not on file  . Number of children: Not on file  . Years of education: Not on file  . Highest education level: Not on file  Occupational History  . Not on file  Social Needs  . Financial resource strain: Not on file  . Food insecurity:  Worry: Not on file    Inability: Not on file  . Transportation needs:    Medical: Not on file    Non-medical: Not on file  Tobacco Use  . Smoking status: Current Every Day Smoker    Packs/day: 0.75    Types: Cigarettes  . Smokeless tobacco: Former Neurosurgeon  . Tobacco comment: quit 8/14/21015  Substance and Sexual Activity  . Alcohol use: Yes    Alcohol/week: 0.0 oz  . Drug use: Yes    Types: Marijuana  . Sexual activity: Never  Lifestyle  . Physical activity:    Days per week: Not on file    Minutes per session: Not on file  . Stress: Not on file  Relationships  . Social connections:    Talks on phone: Not on file    Gets together: Not on file     Attends religious service: Not on file    Active member of club or organization: Not on file    Attends meetings of clubs or organizations: Not on file    Relationship status: Not on file  . Intimate partner violence:    Fear of current or ex partner: Not on file    Emotionally abused: Not on file    Physically abused: Not on file    Forced sexual activity: Not on file  Other Topics Concern  . Not on file  Social History Narrative  . Not on file     Review of Systems  Constitutional: Negative for activity change, appetite change, chills, diaphoresis, fatigue, fever and unexpected weight change.  HENT: Positive for congestion, ear pain, postnasal drip, rhinorrhea, sneezing and sore throat. Negative for dental problem, ear discharge, facial swelling, mouth sores, tinnitus, trouble swallowing and voice change.   Eyes: Positive for itching. Negative for photophobia, pain, discharge, redness and visual disturbance.  Cardiovascular: Negative.  Negative for chest pain, palpitations and leg swelling.  Gastrointestinal: Negative.   Endocrine: Negative.   Genitourinary: Negative.   Musculoskeletal: Negative.   Skin: Negative.  Negative for color change, pallor and rash.  Allergic/Immunologic: Positive for environmental allergies. Negative for immunocompromised state.  Neurological: Positive for headaches. Negative for dizziness, syncope, facial asymmetry, weakness and light-headedness.  Hematological: Negative.   Psychiatric/Behavioral: Negative.   All other systems reviewed and are negative.      Objective:    Vitals:   08/17/17 1511  BP: 132/86  Pulse: 97  Resp: 16  Temp: 98.6 F (37 C)  TempSrc: Oral  SpO2: 97%  Weight: 218 lb 3.2 oz (99 kg)  Height:  (1.803 m)      Physical Exam  Constitutional: He is oriented to person, place, and time. He appears well-developed and well-nourished.  Non-toxic appearance. He does not appear ill. No distress.  HENT:  Head:  Normocephalic and atraumatic.  Right Ear: Tympanic membrane, external ear and ear canal normal. No drainage, swelling or tenderness. No middle ear effusion.  Left Ear: External ear normal. Decreased hearing is noted.  Nose: No mucosal edema or rhinorrhea. Right sinus exhibits no maxillary sinus tenderness and no frontal sinus tenderness. Left sinus exhibits no maxillary sinus tenderness and no frontal sinus tenderness.  Mouth/Throat: Uvula is midline and mucous membranes are normal. No oral lesions. No trismus in the jaw. No uvula swelling. No tonsillar abscesses.  1+ bilateral tonsils, symmetrical, 2 areas of small exudate on right tonsil, no cervical lymphadenopathy, uvula midline, OP diffusely erythematous and mildly edematous, no stridor, airway intact, no phonation, no  trismus Nasal mucosa erythematous with moderate discharge, congested, bilateral nasal turbinates boggy and enlarged  Left TM occluded by impacted cerumen, dark brown  Eyes: Pupils are equal, round, and reactive to light. Conjunctivae, EOM and lids are normal.  Neck: Trachea normal, normal range of motion and phonation normal. Neck supple. No tracheal deviation present. Thyromegaly present.  Cardiovascular: Normal rate, regular rhythm, normal heart sounds, intact distal pulses and normal pulses. Exam reveals no gallop and no friction rub.  No murmur heard. Pulses:      Radial pulses are 2+ on the right side, and 2+ on the left side.       Posterior tibial pulses are 2+ on the right side, and 2+ on the left side.  Pulmonary/Chest: Effort normal and breath sounds normal. No stridor. No respiratory distress. He has no wheezes. He has no rhonchi. He has no rales.  Abdominal: Soft. Normal appearance and bowel sounds are normal. He exhibits no distension. There is no tenderness. There is no rebound and no guarding.  Musculoskeletal: Normal range of motion. He exhibits no edema.  Lymphadenopathy:    He has no cervical adenopathy.    Neurological: He is alert and oriented to person, place, and time. Gait normal.  Skin: Skin is warm, dry and intact. Capillary refill takes less than 2 seconds. No rash noted. He is not diaphoretic.  Psychiatric: He has a normal mood and affect. His speech is normal and behavior is normal.  Nursing note and vitals reviewed.   Procedure: Cerumen Disimpaction  Left cerumen impaction - soft dark brown wax Removed large amount with direct visualization with curette, until small opening visible. Gentle ear lavage with warm water and hydrogen peroxide performed on the left . There were no complications and following the disimpaction.  The tympanic membrane was visible on the left. Tympanic membranes are intact following the procedure.  Auditory canals are mildly edematous and moderately to severely erythematous.  The patient reported improved hearing, and persistent tenderness after removal of cerumen.        Assessment & Plan:      ICD-10-CM   1. Left ear impacted cerumen H61.22   2. Pharyngitis, unspecified etiology J02.9   3. Upper respiratory tract infection, unspecified type J06.9 benzonatate (TESSALON) 100 MG capsule  4. Allergic rhinitis, unspecified seasonality, unspecified trigger J30.9 mometasone (NASONEX) 50 MCG/ACT nasal spray  5. Acute otitis externa of left ear, unspecified type H60.502 neomycin-polymyxin-hydrocortisone (CORTISPORIN) OTIC solution    Left cerumen impaction, removed, external auditory canal appeared very irritated and erythematous, mildly edematous, will cover with Cortisporin drops.  Reviewed earwax removal with Debrox and irrigation  Patient's main complaint is pharyngitis with some exudate, 2 small areas of purulence on his right tonsil, tonsils symmetrical bilaterally, no cervical lymphadenopathy.  Rapid strep is negative, ear oropharynx and nasal mucosa is also erythematous, mild cough with clear lungs on exam, suspect he has a upper respiratory infection,  with baseline allergies.  Advised to start Sudafed, Mucinex, resume taking allergy medication, second-generation antihistamine and steroid nasal spray.  Danelle Berry, PA-C 08/17/17 3:26 PM

## 2017-08-17 NOTE — Patient Instructions (Signed)
Start sudafed, mucinex for cough (if you feel its worsening) and drink a ton of water with it.  Start doing daily antihistamine like claritin or zyrtec, add the steroid nasal spray to it to help with the congestion and postnasal drip.  For your ears you need to try earwax softening drops and gentle irrigation with warm clean water, do not use Q-tips in your ears.  Debrox drops over the counter -use as directed    Please call or return to clinic for recheck if you have any new fever, chest tightness, wheeze, shortness of breath, or severe sinus or facial pain with fever.  This things are concerning for new secondary bacterial infections and may need antibiotics.  Currently you appear to have a viral illness which will run its course.  Treatment is supportive and symptomatic.   Earwax Buildup, Adult The ears produce a substance called earwax that helps keep bacteria out of the ear and protects the skin in the ear canal. Occasionally, earwax can build up in the ear and cause discomfort or hearing loss. What increases the risk? This condition is more likely to develop in people who:  Are male.  Are elderly.  Naturally produce more earwax.  Clean their ears often with cotton swabs.  Use earplugs often.  Use in-ear headphones often.  Wear hearing aids.  Have narrow ear canals.  Have earwax that is overly thick or sticky.  Have eczema.  Are dehydrated.  Have excess hair in the ear canal.  What are the signs or symptoms? Symptoms of this condition include:  Reduced or muffled hearing.  A feeling of fullness in the ear or feeling that the ear is plugged.  Fluid coming from the ear.  Ear pain.  Ear itch.  Ringing in the ear.  Coughing.  An obvious piece of earwax that can be seen inside the ear canal.  How is this diagnosed? This condition may be diagnosed based on:  Your symptoms.  Your medical history.  An ear exam. During the exam, your health care  provider will look into your ear with an instrument called an otoscope.  You may have tests, including a hearing test. How is this treated? This condition may be treated by:  Using ear drops to soften the earwax.  Having the earwax removed by a health care provider. The health care provider may: ? Flush the ear with water. ? Use an instrument that has a loop on the end (curette). ? Use a suction device.  Surgery to remove the wax buildup. This may be done in severe cases.  Follow these instructions at home:  Take over-the-counter and prescription medicines only as told by your health care provider.  Do not put any objects, including cotton swabs, into your ear. You can clean the opening of your ear canal with a washcloth or facial tissue.  Follow instructions from your health care provider about cleaning your ears. Do not over-clean your ears.  Drink enough fluid to keep your urine clear or pale yellow. This will help to thin the earwax.  Keep all follow-up visits as told by your health care provider. If earwax builds up in your ears often or if you use hearing aids, consider seeing your health care provider for routine, preventive ear cleanings. Ask your health care provider how often you should schedule your cleanings.  If you have hearing aids, clean them according to instructions from the manufacturer and your health care provider. Contact a health care provider if:  You have ear pain.  You develop a fever.  You have blood, pus, or other fluid coming from your ear.  You have hearing loss.  You have ringing in your ears that does not go away.  Your symptoms do not improve with treatment.  You feel like the room is spinning (vertigo). Summary  Earwax can build up in the ear and cause discomfort or hearing loss.  The most common symptoms of this condition include reduced or muffled hearing and a feeling of fullness in the ear or feeling that the ear is plugged.  This  condition may be diagnosed based on your symptoms, your medical history, and an ear exam.  This condition may be treated by using ear drops to soften the earwax or by having the earwax removed by a health care provider.  Do not put any objects, including cotton swabs, into your ear. You can clean the opening of your ear canal with a washcloth or facial tissue. This information is not intended to replace advice given to you by your health care provider. Make sure you discuss any questions you have with your health care provider. Document Released: 04/23/2004 Document Revised: 05/27/2016 Document Reviewed: 05/27/2016 Elsevier Interactive Patient Education  2018 [employer]  Upper Respiratory Infection, Adult Most upper respiratory infections (URIs) are caused by a virus. A URI affects the nose, throat, and upper air passages. The most common type of URI is often called "the common cold." Follow these instructions at home:  Take medicines only as told by your doctor.  Gargle warm saltwater or take cough drops to comfort your throat as told by your doctor.  Use a warm mist humidifier or inhale steam from a shower to increase air moisture. This may make it easier to breathe.  Drink enough fluid to keep your pee (urine) clear or pale yellow.  Eat soups and other clear broths.  Have a healthy diet.  Rest as needed.  Go back to work when your fever is gone or your doctor says it is okay. ? You may need to stay home longer to avoid giving your URI to others. ? You can also wear a face mask and wash your hands often to prevent spread of the virus.  Use your inhaler more if you have asthma.  Do not use any tobacco products, including cigarettes, chewing tobacco, or electronic cigarettes. If you need help quitting, ask your doctor. Contact a doctor if:  You are getting worse, not better.  Your symptoms are not helped by medicine.  You have chills.  You are getting more short of  breath.  You have brown or red mucus.  You have yellow or brown discharge from your nose.  You have pain in your face, especially when you bend forward.  You have a fever.  You have puffy (swollen) neck glands.  You have pain while swallowing.  You have white areas in the back of your throat. Get help right away if:  You have very bad or constant: ? Headache. ? Ear pain. ? Pain in your forehead, behind your eyes, and over your cheekbones (sinus pain). ? Chest pain.  You have long-lasting (chronic) lung disease and any of the following: ? Wheezing. ? Long-lasting cough. ? Coughing up blood. ? A change in your usual mucus.  You have a stiff neck.  You have changes in your: ? Vision. ? Hearing. ? Thinking. ? Mood. This information is not intended to replace advice given to you by  your health care provider. Make sure you discuss any questions you have with your health care provider. Document Released: 09/02/2007 Document Revised: 11/17/2015 Document Reviewed: 06/21/2013 Elsevier Interactive Patient Education  2018 [employer].

## 2017-08-17 NOTE — Addendum Note (Signed)
Addended by: Phineas Semen A on: 08/17/2017 04:33 PM   Modules accepted: Orders

## 2017-08-19 ENCOUNTER — Telehealth: Payer: Self-pay

## 2017-08-19 LAB — STREP GROUP A AG, W/REFLEX TO CULT: STREPTOCOCCUS, GROUP A SCREEN (DIRECT): NOT DETECTED

## 2017-08-19 LAB — CULTURE, GROUP A STREP
MICRO NUMBER:: 90616385
SPECIMEN QUALITY:: ADEQUATE

## 2017-08-19 NOTE — Progress Notes (Signed)
Negative throat culture - pt will be notified

## 2017-08-19 NOTE — Telephone Encounter (Signed)
Prior authorization started on Mometasone furoate 50 mcg/act

## 2017-08-30 NOTE — Telephone Encounter (Signed)
Sent to provider who prescribed

## 2017-08-30 NOTE — Telephone Encounter (Signed)
Prior authorization for mometasone was denied this is non preferred. Would you like to switch to a preferred? Flonase, Ipratropium spray or olopatadine nasal

## 2017-08-30 NOTE — Telephone Encounter (Signed)
Flonase is fine, thanks, or he can get which ever is cheapest over the counter - just a steroid nasal spray.

## 2017-09-06 ENCOUNTER — Telehealth: Payer: Self-pay | Admitting: Family Medicine

## 2017-09-06 NOTE — Telephone Encounter (Signed)
Patient called in requesting a referral for L5 Pars Defect.Please advise?

## 2017-09-06 NOTE — Telephone Encounter (Signed)
He has to schedule appointment to document need to return, as he has medicaid

## 2017-09-07 NOTE — Telephone Encounter (Signed)
Spoke with patient and informed him per Dr. Jeanice Limurham that an appointment is needed to document reason for return per your insurance Patient verbalized understanding and is scheduled to see Dr. Jeanice Limurham on 09/14/17

## 2017-09-07 NOTE — Telephone Encounter (Signed)
Left message return call

## 2017-09-08 NOTE — Telephone Encounter (Signed)
Call placed to patient he is aware that the Prior Authorization was denied for the Nasonex  and that he can use an otc nasal spray

## 2017-09-14 ENCOUNTER — Encounter: Payer: Self-pay | Admitting: Family Medicine

## 2017-09-14 ENCOUNTER — Ambulatory Visit (INDEPENDENT_AMBULATORY_CARE_PROVIDER_SITE_OTHER): Payer: Medicaid Other | Admitting: Family Medicine

## 2017-09-14 ENCOUNTER — Other Ambulatory Visit: Payer: Self-pay

## 2017-09-14 VITALS — BP 138/72 | HR 62 | Temp 98.4°F | Resp 14 | Ht 71.0 in | Wt 219.0 lb

## 2017-09-14 DIAGNOSIS — M4310 Spondylolisthesis, site unspecified: Secondary | ICD-10-CM | POA: Insufficient documentation

## 2017-09-14 DIAGNOSIS — M5410 Radiculopathy, site unspecified: Secondary | ICD-10-CM | POA: Diagnosis not present

## 2017-09-14 DIAGNOSIS — M4316 Spondylolisthesis, lumbar region: Secondary | ICD-10-CM | POA: Diagnosis not present

## 2017-09-14 DIAGNOSIS — M4306 Spondylolysis, lumbar region: Secondary | ICD-10-CM

## 2017-09-14 MED ORDER — HYDROCODONE-ACETAMINOPHEN 5-325 MG PO TABS: 1.0000 | ORAL_TABLET | Freq: Four times a day (QID) | ORAL | 0 refills | Status: DC | PRN

## 2017-09-14 MED ORDER — METHYLPREDNISOLONE 4 MG PO TBPK: ORAL_TABLET | ORAL | 0 refills | Status: DC

## 2017-09-14 NOTE — Patient Instructions (Signed)
Take pain medication as prescribed  Take medrol dosepak MRI to be done  Referral to orthopedics

## 2017-09-14 NOTE — Progress Notes (Signed)
   Subjective:    Patient ID: Mark Zuniga, male    DOB: 03-May-1995, 22 y.o.   MRN: [id number]  Patient presents for Lower Back Pain (increased back pain that radiates to mid back- when standing he can have some numbness and tingling to toes) Patient here with severe back pain that radiates down mostly on the right side he is also had tingling and numbness in his foot.  This is been worse for the past 3 to 4 days but he has been having increased flares of his back pain.  He has chronic back pain secondary to pars defect as well as spondylolisthesis.  He was seen by spine specialist about 24 to 3 years ago.  He did have an MRI at that time confirming these diagnoses.  They tried to get him in physical therapy and some injections but there was some insurance problems.  He has had back pain since he had an injury in football as a teenager.  There is been no change in bowel or bladder    Review Of Systems:  GEN- denies fatigue, fever, weight loss,weakness, recent illness HEENT- denies eye drainage, change in vision, nasal discharge, CVS- denies chest pain, palpitations RESP- denies SOB, cough, wheeze ABD- denies N/V, change in stools, abd pain GU- denies dysuria, hematuria, dribbling, incontinence MSK- +joint pain, muscle aches, injury Neuro- denies headache, dizziness, syncope, seizure activity       Objective:    BP 138/72   Pulse 62   Temp 98.4 F (36.9 C) (Oral)   Resp 14   Ht 5\' 11"  (1.803 m)   Wt 219 lb (99.3 kg)   SpO2 96%   BMI 30.54 kg/m  GEN- NAD, alert and oriented x3 CVS- RRR, no murmur RESP-CTAB MSK- TTP lumbar spine, + SLR Right side, fair ROM, no spasm, FROM Hips/knees Neuro- normal Tone LE, decreased monofilament in great toe right foot, sensation otherwise in tact, non antalgic gait, DTR symmetric  EXT- No edema Pulses- Radial, DP- 2+        Assessment & Plan:      Problem List Items Addressed This Visit      Unprioritized   Pars defect of lumbar  spine - Primary   Spondylisthesis    Worsening back pain with radiculopathy and paresthesias in the right foot.  He has been evaluated by neurosurgery in the past they were going to do injections on him but there was some insurance difficulties.  We discussed the importance of an activity he sits around most of the day watching TV playing video games or sleeps.  He has gained a significant amount of weight as well which is contributing to his back pain.  I will obtain an MRI of his lumbar spine to reevaluate the spondylolisthesis and get him over to spine surgeon for follow-up.  He has been given Medrol Dosepak as well as a short-term course of hydrocodone.      Relevant Orders   MR Lumbar Spine Wo Contrast    Other Visit Diagnoses    Back pain with right-sided radiculopathy       Relevant Medications   methylPREDNISolone (MEDROL DOSEPAK) 4 MG TBPK tablet   HYDROcodone-acetaminophen (NORCO) 5-325 MG tablet      Note: This dictation was prepared with Dragon dictation along with smaller phrase technology. Any transcriptional errors that result from this process are unintentional.

## 2017-09-15 ENCOUNTER — Encounter: Payer: Self-pay | Admitting: Family Medicine

## 2017-09-15 NOTE — Assessment & Plan Note (Addendum)
Worsening back pain with radiculopathy and paresthesias in the right foot.  He has been evaluated by neurosurgery in the past they were going to do injections on him but there was some insurance difficulties.  We discussed the importance of an activity he sits around most of the day watching TV playing video games or sleeps.  He has gained a significant amount of weight as well which is contributing to his back pain.  I will obtain an MRI of his lumbar spine to reevaluate the spondylolisthesis and get him over to spine surgeon for follow-up.  He has been given Medrol Dosepak as well as a short-term course of hydrocodone.

## 2017-10-06 ENCOUNTER — Inpatient Hospital Stay: Admission: RE | Admit: 2017-10-06 | Payer: Medicaid Other | Source: Ambulatory Visit

## 2017-10-07 ENCOUNTER — Ambulatory Visit
Admission: RE | Admit: 2017-10-07 | Discharge: 2017-10-07 | Disposition: A | Discharge status: Home / Self Care | Payer: Medicaid Other | Source: Ambulatory Visit | Attending: Family Medicine | Admitting: Family Medicine

## 2017-10-07 DIAGNOSIS — M4316 Spondylolisthesis, lumbar region: Secondary | ICD-10-CM

## 2017-10-07 DIAGNOSIS — M545 Low back pain: Secondary | ICD-10-CM | POA: Diagnosis not present

## 2017-10-11 ENCOUNTER — Other Ambulatory Visit: Payer: Self-pay | Admitting: *Deleted

## 2017-10-11 DIAGNOSIS — M4306 Spondylolysis, lumbar region: Secondary | ICD-10-CM

## 2017-10-11 DIAGNOSIS — M4316 Spondylolisthesis, lumbar region: Secondary | ICD-10-CM

## 2017-10-11 DIAGNOSIS — S22000A Wedge compression fracture of unspecified thoracic vertebra, initial encounter for closed fracture: Secondary | ICD-10-CM

## 2017-10-11 DIAGNOSIS — M5410 Radiculopathy, site unspecified: Secondary | ICD-10-CM

## 2017-10-14 DIAGNOSIS — M546 Pain in thoracic spine: Secondary | ICD-10-CM | POA: Diagnosis not present

## 2017-10-14 DIAGNOSIS — R5383 Other fatigue: Secondary | ICD-10-CM | POA: Diagnosis not present

## 2017-10-14 DIAGNOSIS — S22080A Wedge compression fracture of T11-T12 vertebra, initial encounter for closed fracture: Secondary | ICD-10-CM | POA: Diagnosis not present

## 2017-10-14 DIAGNOSIS — M545 Low back pain: Secondary | ICD-10-CM | POA: Diagnosis not present

## 2017-10-14 DIAGNOSIS — E559 Vitamin D deficiency, unspecified: Secondary | ICD-10-CM | POA: Diagnosis not present

## 2017-10-14 DIAGNOSIS — R200 Anesthesia of skin: Secondary | ICD-10-CM | POA: Diagnosis not present

## 2017-10-19 ENCOUNTER — Other Ambulatory Visit: Payer: Self-pay | Admitting: *Deleted

## 2017-10-19 DIAGNOSIS — E039 Hypothyroidism, unspecified: Secondary | ICD-10-CM

## 2017-10-21 DIAGNOSIS — M810 Age-related osteoporosis without current pathological fracture: Secondary | ICD-10-CM | POA: Diagnosis not present

## 2017-10-21 DIAGNOSIS — M8589 Other specified disorders of bone density and structure, multiple sites: Secondary | ICD-10-CM | POA: Diagnosis not present

## 2017-10-27 DIAGNOSIS — M545 Low back pain: Secondary | ICD-10-CM | POA: Diagnosis not present

## 2017-10-27 DIAGNOSIS — M810 Age-related osteoporosis without current pathological fracture: Secondary | ICD-10-CM | POA: Diagnosis not present

## 2017-11-22 ENCOUNTER — Encounter: Payer: Self-pay | Admitting: Physician Assistant

## 2017-11-22 ENCOUNTER — Ambulatory Visit (INDEPENDENT_AMBULATORY_CARE_PROVIDER_SITE_OTHER): Payer: Medicaid Other | Admitting: Physician Assistant

## 2017-11-22 ENCOUNTER — Other Ambulatory Visit: Payer: Self-pay

## 2017-11-22 VITALS — BP 126/80 | HR 98 | Temp 98.3°F | Resp 16 | Ht 71.0 in | Wt 229.0 lb

## 2017-11-22 DIAGNOSIS — S31105A Unspecified open wound of abdominal wall, periumbilic region without penetration into peritoneal cavity, initial encounter: Secondary | ICD-10-CM

## 2017-11-22 DIAGNOSIS — K5909 Other constipation: Secondary | ICD-10-CM

## 2017-11-22 MED ORDER — CEPHALEXIN 500 MG PO CAPS: 500.0000 mg | ORAL_CAPSULE | Freq: Four times a day (QID) | ORAL | 0 refills | Status: AC

## 2017-11-22 NOTE — Progress Notes (Signed)
Patient ID: Mark Zuniga MRN: [id number], DOB: 1996-01-30, 22 y.o. Date of Encounter: 11/22/2017, 4:50 PM    Chief Complaint:  Chief Complaint  Patient presents with  . Naval Drainage    x1 day- foul smelling drainage from naval     HPI: 22 y.o. year old male presents with above.   States that he has not had this problem with his umbilical site before. States that the first thing he noticed was that last night he noticed that "it felt a little bit uncomfortable, but that was it." States this morning he noticed that there was a drainage.  States that it mostly looked clear with a little bit of blood.   He also reports that he has had some problems with constipation over the past month. States that usually he will have BM 2 or 3 times per day. States that recently he would sometimes have the sensation that he needed to go to the bathroom but then would go sit on the toilet and straining but nothing come out. He did use an over-the-counter laxative last night and that was the first time he has used any treatment like that. States that just recently sometimes he would only have one BM per day or may even skip one day out of the week of having no BM.  Thinks that this straining related to the constipation may have contributed to the issue with his umbilical site.     Home Meds:   Outpatient Medications Prior to Visit  Medication Sig Dispense Refill  . levothyroxine (SYNTHROID, LEVOTHROID) 88 MCG tablet Take 1 tablet (88 mcg total) by mouth daily. 30 tablet 3  . Vitamin D, Ergocalciferol, (DRISDOL) 50000 units CAPS capsule Take 50,000 Units by mouth every 7 (seven) days.    Marland Kitchen. HYDROcodone-acetaminophen (NORCO) 5-325 MG tablet Take 1 tablet by mouth every 6 (six) hours as needed for moderate pain. (Patient not taking: Reported on 11/22/2017) 20 tablet 0  . methylPREDNISolone (MEDROL DOSEPAK) 4 MG TBPK tablet Take as directed on package (Patient not taking: Reported on 11/22/2017)  21 tablet 0   No facility-administered medications prior to visit.     Allergies:  Allergies  Allergen Reactions  . Sulfa Drugs Cross Reactors Other (See Comments)    unknown      Review of Systems: See HPI for pertinent ROS. All other ROS negative.    Physical Exam: Blood pressure 126/80, pulse 98, temperature 98.3 F (36.8 C), temperature source Oral, resp. rate 16, height 5\' 11"  (1.803 m), weight 103.9 kg, SpO2 97 %., Body mass index is 31.94 kg/m. General:  WM. Appears in no acute distress. Neck: Supple. No thyromegaly. No lymphadenopathy. Lungs: Clear bilaterally to auscultation without wheezes, rales, or rhonchi. Breathing is unlabored. Heart: Regular rhythm. No murmurs, rubs, or gallops. Abdomen: Soft, non-tender, non-distended with normoactive bowel sounds. No hepatomegaly. No rebound/guarding. No obvious abdominal masses. Umbilicus: His umbilicus does go down deeper than most peoples.  There is small amount of dried blood on side of umbilicus.  Otherwise, appearance / inspection appears normal.  I inserted cotton swab -- There is tiny 2 mm fleshy tissue that is exposed.  Remainder of site appears normal.  There is no purulent drainage. No bleeding. No cellulitis or surrounding infection.  Msk:  Strength and tone normal for age. Extremities/Skin: Warm and dry.  Neuro: Alert and oriented X 3. Moves all extremities spontaneously. Gait is normal. CNII-XII grossly in tact. Psych:  Responds to questions appropriately with  a normal affect.     ASSESSMENT AND PLAN:  22 y.o. year old male with   1. Open wound of umbilical region, initial encounter He is to keep site dry.  He is to take Keflex as directed.  He is to monitor site carefully.  If site worsens at all then follow-up immediately. - cephALEXin (KEFLEX) 500 MG capsule; Take 1 capsule (500 mg total) by mouth 4 (four) times daily for 10 days.  Dispense: 28 capsule; Refill: 0  2. Chronic constipation He is to add  Miralax 2 capfuls once per day.  Can adjust amount of Miralax depending on stools. Also discussed increasing water intake and gradually increase fiber intake.  Also increase fruits and vegetables.  If this worsens or is not controlled with these measures, then follow-up.   [id number] [place], [place], [facility] 11/22/2017 4:50 PM

## 2018-01-24 ENCOUNTER — Ambulatory Visit: Payer: Medicaid Other | Admitting: Physician Assistant

## 2018-04-06 ENCOUNTER — Ambulatory Visit (INDEPENDENT_AMBULATORY_CARE_PROVIDER_SITE_OTHER): Payer: Medicaid Other | Admitting: Endocrinology

## 2018-04-06 ENCOUNTER — Encounter: Payer: Self-pay | Admitting: Endocrinology

## 2018-04-06 VITALS — BP 118/86 | HR 90 | Ht 71.0 in | Wt 225.0 lb

## 2018-04-06 DIAGNOSIS — R739 Hyperglycemia, unspecified: Secondary | ICD-10-CM

## 2018-04-06 DIAGNOSIS — M810 Age-related osteoporosis without current pathological fracture: Secondary | ICD-10-CM

## 2018-04-06 DIAGNOSIS — R7989 Other specified abnormal findings of blood chemistry: Secondary | ICD-10-CM | POA: Diagnosis not present

## 2018-04-06 DIAGNOSIS — E039 Hypothyroidism, unspecified: Secondary | ICD-10-CM | POA: Diagnosis not present

## 2018-04-06 LAB — T4, FREE: Free T4: 1.02 ng/dL (ref 0.60–1.60)

## 2018-04-06 LAB — TSH: TSH: 5.87 u[IU]/mL — ABNORMAL HIGH (ref 0.35–4.50)

## 2018-04-06 LAB — CORTISOL: Cortisol, Plasma: 7 ug/dL

## 2018-04-06 LAB — VITAMIN D 25 HYDROXY (VIT D DEFICIENCY, FRACTURES): VITD: 43.16 ng/mL (ref 30.00–100.00)

## 2018-04-06 LAB — HEMOGLOBIN A1C: Hgb A1c MFr Bld: 5.3 % (ref 4.6–6.5)

## 2018-04-06 MED ORDER — VITAMIN D (ERGOCALCIFEROL) 1.25 MG (50000 UNIT) PO CAPS: 50000.0000 [IU] | ORAL_CAPSULE | ORAL | 0 refills | Status: DC

## 2018-04-06 MED ORDER — LEVOTHYROXINE SODIUM 100 MCG PO TABS: 100.0000 ug | ORAL_TABLET | Freq: Every day | ORAL | 2 refills | Status: DC

## 2018-04-06 NOTE — Patient Instructions (Addendum)
blood tests are requested for you today.  We'll let you know about the results.   Please come back for a follow-up appointment in 6 months.  

## 2018-04-06 NOTE — Progress Notes (Signed)
Subjective:    Patient ID: Mark Zuniga, male    DOB: June 08, 1995, 23 y.o.   MRN: [id number]  HPI Pt is referred by Dr Buelah Manis, for osteoporosis.  Pt was noted to have osteoporosis in 2019, when he presented with traumatic comp fxs of L-5 and T-12.  he has never been on medication for this.  he has no history of any of the following: low testosterone, multiple myeloma, renal dz, prolonged bedrest, steroids, alcoholism, liver dz, primary hyperparathyroidism.  he does not take heparin or anticonvulsants.   Pt is also here for for hypothyroidism.  Pt reports hypothyroidism was dx'ed in 2007.  He has been on prescribed thyroid hormone therapy since then.  He has never taken kelp or any other type of non-prescribed thyroid product.  He has never had thyroid imaging.  He has never had thyroid surgery, or XRT to the neck.  He has never been on amiodarone or lithium.  He was rx'ed with high-dose ergocalciferol in mid-2019--he still takes BIW.  He quit smoking in 2017.   He reports moderate pain at the back, and assoc lightheadedness (extensive w/u neg).   Past Medical History:  Diagnosis Date  . Migraine   . Thyroid disease     No past surgical history on file.  Social History   Socioeconomic History  . Marital status: Single    Spouse name: Not on file  . Number of children: Not on file  . Years of education: Not on file  . Highest education level: Not on file  Occupational History  . Not on file  Social Needs  . Financial resource strain: Not on file  . Food insecurity:    Worry: Not on file    Inability: Not on file  . Transportation needs:    Medical: Not on file    Non-medical: Not on file  Tobacco Use  . Smoking status: Former Smoker    Packs/day: 0.75    Types: Cigarettes  . Smokeless tobacco: Former Systems developer  . Tobacco comment: quit 8/14/21015  Substance and Sexual Activity  . Alcohol use: Not Currently    Alcohol/week: 0.0 standard drinks    Frequency: Never  . Drug  use: Not Currently    Types: Marijuana  . Sexual activity: Not Currently  Lifestyle  . Physical activity:    Days per week: Not on file    Minutes per session: Not on file  . Stress: Not on file  Relationships  . Social connections:    Talks on phone: Not on file    Gets together: Not on file    Attends religious service: Not on file    Active member of club or organization: Not on file    Attends meetings of clubs or organizations: Not on file    Relationship status: Not on file  . Intimate partner violence:    Fear of current or ex partner: Not on file    Emotionally abused: Not on file    Physically abused: Not on file    Forced sexual activity: Not on file  Other Topics Concern  . Not on file  Social History Narrative  . Not on file    Current Outpatient Medications on File Prior to Visit  Medication Sig Dispense Refill  . HYDROcodone-acetaminophen (NORCO) 5-325 MG tablet Take 1 tablet by mouth every 6 (six) hours as needed for moderate pain. (Patient not taking: Reported on 11/22/2017) 20 tablet 0   No current facility-administered medications on  file prior to visit.     Allergies  Allergen Reactions  . Sulfa Drugs Cross Reactors Other (See Comments)    unknown    Family History  Problem Relation Age of Onset  . Osteoporosis Maternal Grandmother     BP 118/86 (BP Location: Right Arm, Patient Position: Sitting, Cuff Size: Normal)   Pulse 90   Ht _0  (1.803 m)   Wt 225 lb (102.1 kg)   SpO2 98%   BMI 31.38 kg/m     Review of Systems denies depression, hair loss, muscle cramps, sob, memory loss, blurry vision, cold intolerance, myalgias, rhinorrhea, easy bruising, and syncope.  He has weight gain, slight bilat foot numbness, dry skin, and constipation.      Objective:   Physical Exam VS: see vs page GEN: no distress HEAD: head: no deformity eyes: no periorbital swelling, no proptosis external nose and ears are normal mouth: no lesion seen NECK:  supple, thyroid is not enlarged CHEST WALL: no deformity.  No kyphosis LUNGS: clear to auscultation CV: reg rate and rhythm, no murmur ABD: abdomen is soft, nontender.  no hepatosplenomegaly.  not distended.  no hernia.  MUSCULOSKELETAL: muscle bulk and strength are grossly normal.  no obvious joint swelling.  gait is normal and steady.  No spinal tenderness EXTEMITIES: no deformity.  no edema PULSES: no carotid bruit NEURO:  cn 2-12 grossly intact.   readily moves all 4's.  sensation is intact to touch on all 4's SKIN:  Normal texture and temperature.  No rash or suspicious lesion is visible.   NODES:  None palpable at the neck PSYCH: alert, well-oriented.  Does not appear anxious nor depressed.  Lab Results  Component Value Date   TSH 3.68 06/29/2017   I have reviewed outside records, and summarized: Pt was noted to have osteoporosis, and referred here.  He was also noted to have elevated TSH, despite being on synthroid.  outside test results are reviewed: DEXA:  Worst T-score is -3.7 (spine)  25-OH-vit-D=7  Lab Results  Component Value Date   TESTOSTERONE 131 (L) 04/06/2018      Assessment & Plan:  Low testosterone, new, uncertain etiology.  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   Osteoporosis: poss related to the above.   Patient Instructions  blood tests are requested for you today.  We'll let you know about the results.    Please come back for a follow-up appointment in 6 months.

## 2018-04-07 LAB — TESTOSTERONE,FREE AND TOTAL
Testosterone, Free: 6.2 pg/mL — ABNORMAL LOW (ref 9.3–26.5)
Testosterone: 131 ng/dL — ABNORMAL LOW (ref 264–916)

## 2018-04-07 LAB — PTH, INTACT AND CALCIUM
Calcium: 9.6 mg/dL (ref 8.6–10.3)
PTH: 48 pg/mL (ref 14–64)

## 2018-04-08 ENCOUNTER — Telehealth: Payer: Self-pay | Admitting: Endocrinology

## 2018-04-08 DIAGNOSIS — R7989 Other specified abnormal findings of blood chemistry: Secondary | ICD-10-CM | POA: Insufficient documentation

## 2018-04-08 NOTE — Telephone Encounter (Signed)
Spoke to pt concerning results and pt stated understanding, said that he would call back to schedule appt for labs and 1 month follow up

## 2018-04-08 NOTE — Telephone Encounter (Signed)
Patient called [facility] yesterday @ 5:05pm  Stated he was returning a call regarding his lab results.

## 2018-04-08 NOTE — Progress Notes (Signed)
Second attempt to reach pt unsuccessful. LVM requesting returned call. 

## 2018-05-16 ENCOUNTER — Ambulatory Visit: Payer: Medicaid Other | Admitting: Endocrinology

## 2018-05-19 ENCOUNTER — Ambulatory Visit: Payer: Medicaid Other | Admitting: Endocrinology

## 2018-05-31 ENCOUNTER — Ambulatory Visit (INDEPENDENT_AMBULATORY_CARE_PROVIDER_SITE_OTHER): Payer: Medicaid Other | Admitting: Endocrinology

## 2018-05-31 ENCOUNTER — Encounter: Payer: Self-pay | Admitting: Endocrinology

## 2018-05-31 VITALS — BP 130/84 | HR 93 | Ht 71.0 in | Wt 223.6 lb

## 2018-05-31 DIAGNOSIS — R200 Anesthesia of skin: Secondary | ICD-10-CM | POA: Diagnosis not present

## 2018-05-31 DIAGNOSIS — R7989 Other specified abnormal findings of blood chemistry: Secondary | ICD-10-CM | POA: Diagnosis not present

## 2018-05-31 DIAGNOSIS — M810 Age-related osteoporosis without current pathological fracture: Secondary | ICD-10-CM

## 2018-05-31 DIAGNOSIS — E039 Hypothyroidism, unspecified: Secondary | ICD-10-CM

## 2018-05-31 LAB — BASIC METABOLIC PANEL
BUN: 15 mg/dL (ref 6–23)
CALCIUM: 9.4 mg/dL (ref 8.4–10.5)
CO2: 24 mEq/L (ref 19–32)
CREATININE: 0.65 mg/dL (ref 0.40–1.50)
Chloride: 103 mEq/L (ref 96–112)
GFR: 150.13 mL/min (ref >60.00)
Glucose, Bld: 111 mg/dL — ABNORMAL HIGH (ref 70–99)
Potassium: 3.7 mEq/L (ref 3.5–5.1)
Sodium: 136 mEq/L (ref 135–145)

## 2018-05-31 NOTE — Progress Notes (Signed)
Subjective:    Patient ID: Mark Zuniga, male    DOB: 01-27-1996, 23 y.o.   MRN: [id number]  HPI  Pt returns for f/u of osteoporosis: Dx'ed: 2019 Secondary cause: low testosterone, vit-D def and smoking (quit 2017) Fractures: traumatic comp fxs of L-5 and T-12 Past rx: none Current rx: none Last DEXA result (2019): worst T-score was -3.7 (spine) Other: he takes weekly high-dose ergocalciferol.  Interval hx:  Pt is also here for for hypothyroidism (dx'ed 2007; he has been on prescribed thyroid hormone therapy since then; he has never had thyroid imaging.  He has never had thyroid surgery, or XRT to the neck.  He has never been on amiodarone or lithium.  He was rx'ed with high-dose ergocalciferol in mid-2019--he takes q week.  .   Pt has hypogonadism.  Pt reports he had puberty at the normal age.  He has biological children.  He says he has never taken illicit androgens.  He has never been on any prescribed medication for hypogonadism.  He does not take antiandrogens or opioids.  He denies any h/o infertility, XRT, or genital infection.  He has never had surgery, or a serious injury to the head or genital area. He has no h/o sleep apnea or DVT.   He does not consume alcohol excessively.   MRI (2015): no mention is made of the pituitary. He has moderate fatigue, but no assoc ED sxs.   Past Medical History:  Diagnosis Date  . Migraine   . Thyroid disease     No past surgical history on file.  Social History   Socioeconomic History  . Marital status: Single    Spouse name: Not on file  . Number of children: Not on file  . Years of education: Not on file  . Highest education level: Not on file  Occupational History  . Not on file  Social Needs  . Financial resource strain: Not on file  . Food insecurity:    Worry: Not on file    Inability: Not on file  . Transportation needs:    Medical: Not on file    Non-medical: Not on file  Tobacco Use  . Smoking status: Former  Smoker    Packs/day: 0.75    Types: Cigarettes  . Smokeless tobacco: Former Neurosurgeon  . Tobacco comment: quit 8/14/21015  Substance and Sexual Activity  . Alcohol use: Not Currently    Alcohol/week: 0.0 standard drinks    Frequency: Never  . Drug use: Not Currently    Types: Marijuana  . Sexual activity: Not Currently  Lifestyle  . Physical activity:    Days per week: Not on file    Minutes per session: Not on file  . Stress: Not on file  Relationships  . Social connections:    Talks on phone: Not on file    Gets together: Not on file    Attends religious service: Not on file    Active member of club or organization: Not on file    Attends meetings of clubs or organizations: Not on file    Relationship status: Not on file  . Intimate partner violence:    Fear of current or ex partner: Not on file    Emotionally abused: Not on file    Physically abused: Not on file    Forced sexual activity: Not on file  Other Topics Concern  . Not on file  Social History Narrative  . Not on file  Current Outpatient Medications on File Prior to Visit  Medication Sig Dispense Refill  . levothyroxine (SYNTHROID, LEVOTHROID) 100 MCG tablet Take 1 tablet (100 mcg total) by mouth daily before breakfast. 90 tablet 2  . Vitamin D, Ergocalciferol, (DRISDOL) 1.25 MG (50000 UT) CAPS capsule Take 1 capsule (50,000 Units total) by mouth every 7 (seven) days. 10 capsule 0   No current facility-administered medications on file prior to visit.     Allergies  Allergen Reactions  . Sulfa Drugs Cross Reactors Other (See Comments)    unknown    Family History  Problem Relation Age of Onset  . Osteoporosis Maternal Grandmother     BP 130/84 (BP Location: Right Arm, Patient Position: Sitting, Cuff Size: Normal)   Pulse 93   Ht 5\' 11"  (1.803 m)   Wt 223 lb 9.6 oz (101.4 kg)   SpO2 96%   BMI 31.19 kg/m   Review of Systems He denies muscle weakness.  He has intermittent numbness of the feet.       Objective:   Physical Exam VITAL SIGNS:  See vs page GENERAL: no distress GENITALIA: Normal male testicles, scrotum, and penis Skin: normal male hair distribution.   Lab Results  Component Value Date   TESTOSTERONE 141 (L) 05/31/2018   Vit-D=normal Lab Results  Component Value Date   TSH 2.01 05/31/2018       Assessment & Plan:  Hypogonadism: I rx'ed clomid Osteoporosis: plan is to recheck DEXA in July, 2010, when testosterone is back to normal. Hypothyroidism: well-replaced.   Vit-D def: well-replaced.  Patient Instructions  blood tests are requested for you today.  We'll let you know about the results.    Please come back for a follow-up appointment in 2 months.

## 2018-05-31 NOTE — Patient Instructions (Addendum)
blood tests are requested for you today.  We'll let you know about the results.   Please come back for a follow-up appointment in 2 months.  

## 2018-06-01 LAB — VITAMIN B12: VITAMIN B 12: 362 pg/mL (ref 211–911)

## 2018-06-01 LAB — FOLLICLE STIMULATING HORMONE: FSH: 0.4 m[IU]/mL — ABNORMAL LOW (ref 1.4–18.1)

## 2018-06-01 LAB — T4, FREE: Free T4: 1.08 ng/dL (ref 0.60–1.60)

## 2018-06-01 LAB — VITAMIN D 25 HYDROXY (VIT D DEFICIENCY, FRACTURES): VITD: 37.39 ng/mL (ref 30.00–100.00)

## 2018-06-01 LAB — TSH: TSH: 2.01 u[IU]/mL (ref 0.35–4.50)

## 2018-06-01 LAB — LUTEINIZING HORMONE: LH: 3.9 m[IU]/mL (ref 1.50–9.30)

## 2018-06-02 LAB — TESTOSTERONE,FREE AND TOTAL
TESTOSTERONE: 141 ng/dL — AB (ref 264–916)
Testosterone, Free: 8.3 pg/mL — ABNORMAL LOW (ref 9.3–26.5)

## 2018-06-02 LAB — PROLACTIN: PROLACTIN: 3.6 ng/mL (ref 2.0–18.0)

## 2018-06-02 MED ORDER — CLOMIPHENE CITRATE 50 MG PO TABS: ORAL_TABLET | ORAL | 11 refills | Status: DC

## 2018-06-03 ENCOUNTER — Telehealth: Payer: Self-pay | Admitting: Endocrinology

## 2018-06-03 NOTE — Telephone Encounter (Signed)
Patient is returning call to the office in regards to a change in medication from latest labs taken.  Please Advise, Thanks

## 2018-06-03 NOTE — Telephone Encounter (Signed)
Called pt and informed of medication name, use of medication and effects of medication as it pertains to lab results. Verbalized acceptance and understanding.

## 2018-06-30 ENCOUNTER — Other Ambulatory Visit: Payer: Self-pay | Admitting: Endocrinology

## 2018-08-02 ENCOUNTER — Other Ambulatory Visit: Payer: Self-pay

## 2018-08-02 ENCOUNTER — Ambulatory Visit (INDEPENDENT_AMBULATORY_CARE_PROVIDER_SITE_OTHER): Payer: Medicaid Other | Admitting: Endocrinology

## 2018-08-02 DIAGNOSIS — M810 Age-related osteoporosis without current pathological fracture: Secondary | ICD-10-CM | POA: Diagnosis not present

## 2018-08-02 DIAGNOSIS — R7989 Other specified abnormal findings of blood chemistry: Secondary | ICD-10-CM

## 2018-08-02 NOTE — Patient Instructions (Addendum)
blood tests are requested for you today.  We'll let you know about the results.    When blood tests are normal, our plan will be to recheck the bone density, when it is available.    Please come back for a follow-up appointment in 3 months.

## 2018-08-02 NOTE — Progress Notes (Signed)
Subjective:    Patient ID: Mark Zuniga, male    DOB: 09-06-1995, 23 y.o.   MRN: [id number]  HPI  telehealth visit today via doxy video visit.  Alternatives to telehealth are presented to this patient, and the patient agrees to the telehealth visit. Pt is advised of the cost of the visit, and agrees to this, also.   Patient is at home, and I am at the office.   Persons attending the telehealth visit: the patient and I.  Pt returns for f/u of osteoporosis:  Dx'ed: 2019 Secondary cause: low testosterone, vit-D def and smoking (quit 2017).   Fractures: traumatic comp fxs of L-5 and T-12 Past rx: none Current rx: none Last DEXA result (2019): worst T-score was -3.7 (spine) Other: he takes weekly high-dose ergocalciferol; he is disabled Interval hx: for vit-D def, he takes high-dose ergocalciferol since mid-2019, Q week.  He has intermitt moderate cramps in the legs, but no assoc numbness.   Pt is also here for for hypothyroidism (dx'ed 2007; he has been on prescribed thyroid hormone therapy since then; he has never had thyroid imaging).   Pt has hypogonadism (dx'ed 2019; he had puberty at the normal age; he has no biological children, but would like to maintain that option for the future; he was rx'ed clomid in early 2020; MRI (2015) made no mention is made of the pituitary).  Since on clomid, pt states he feels better in general.   Past Medical History:  Diagnosis Date  . Migraine   . Thyroid disease     No past surgical history on file.  Social History   Socioeconomic History  . Marital status: Single    Spouse name: Not on file  . Number of children: Not on file  . Years of education: Not on file  . Highest education level: Not on file  Occupational History  . Not on file  Social Needs  . Financial resource strain: Not on file  . Food insecurity:    Worry: Not on file    Inability: Not on file  . Transportation needs:    Medical: Not on file    Non-medical: Not on  file  Tobacco Use  . Smoking status: Former Smoker    Packs/day: 0.75    Types: Cigarettes  . Smokeless tobacco: Former Neurosurgeon  . Tobacco comment: quit 8/14/21015  Substance and Sexual Activity  . Alcohol use: Not Currently    Alcohol/week: 0.0 standard drinks    Frequency: Never  . Drug use: Not Currently    Types: Marijuana  . Sexual activity: Not Currently  Lifestyle  . Physical activity:    Days per week: Not on file    Minutes per session: Not on file  . Stress: Not on file  Relationships  . Social connections:    Talks on phone: Not on file    Gets together: Not on file    Attends religious service: Not on file    Active member of club or organization: Not on file    Attends meetings of clubs or organizations: Not on file    Relationship status: Not on file  . Intimate partner violence:    Fear of current or ex partner: Not on file    Emotionally abused: Not on file    Physically abused: Not on file    Forced sexual activity: Not on file  Other Topics Concern  . Not on file  Social History Narrative  . Not on  file    Current Outpatient Medications on File Prior to Visit  Medication Sig Dispense Refill  . clomiPHENE (CLOMID) 50 MG tablet 1/4 tab daily 8 tablet 11  . levothyroxine (SYNTHROID, LEVOTHROID) 100 MCG tablet Take 1 tablet (100 mcg total) by mouth daily before breakfast. 90 tablet 2  . Vitamin D, Ergocalciferol, (DRISDOL) 1.25 MG (50000 UT) CAPS capsule TAKE 1 CAPSULE BY MOUTH EVERY 7 DAYS 4 capsule 2   No current facility-administered medications on file prior to visit.     Allergies  Allergen Reactions  . Sulfa Drugs Cross Reactors Other (See Comments)    unknown    Family History  Problem Relation Age of Onset  . Osteoporosis Maternal Grandmother     Review of Systems Denies falls and diarrhea.      Objective:   Physical Exam      Assessment & Plan:  Hypogonadism: due for recheck.  Osteoporosis: checking labs is all we can do until  DEXA is available.   Patient Instructions  blood tests are requested for you today.  We'll let you know about the results.    When blood tests are normal, our plan will be to recheck the bone density, when it is available.    Please come back for a follow-up appointment in 3 months.

## 2018-10-07 ENCOUNTER — Ambulatory Visit: Payer: Medicaid Other | Admitting: Endocrinology

## 2019-03-28 IMAGING — MR MR LUMBAR SPINE W/O CM
5 series · 46 of 48 positions shown · non-contrast
Comparison: Prior MRI from 03/13/2015.

CLINICAL DATA: Initial evaluation for chronic low back pain,
spondylolisthesis.

EXAM:
MRI LUMBAR SPINE WITHOUT CONTRAST
TECHNIQUE: Multiplanar, multisequence MR imaging of the lumbar spine was
performed. No intravenous contrast was administered.

[Series 3: T2 post-contrast · sagittal · 4.0mm · 0.88mm/px · 6 of 13 slices shown]
[im 1/13]
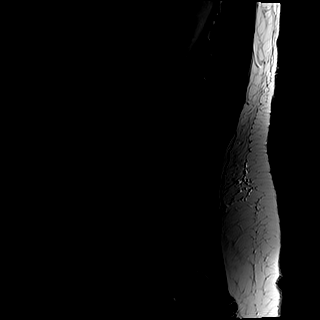
[im 3/13]
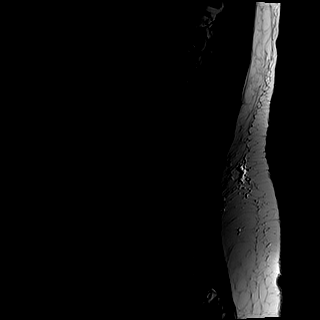
[im 5/13]
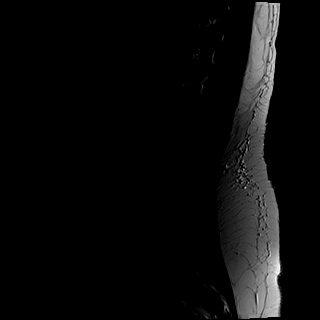
[im 8/13]
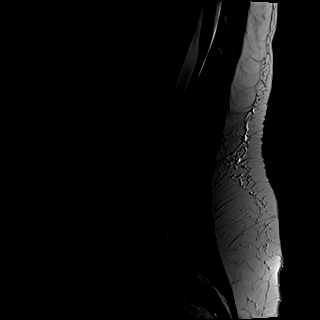
[im 10/13]
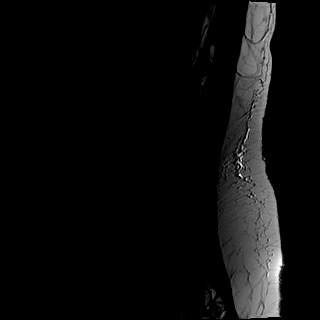
[im 13/13]
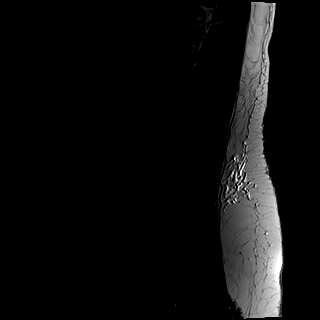

[Series 4: T1 · sagittal · 4.0mm · 0.88mm/px · 5 of 13 slices shown (1 of 2)]
[im 1/13]
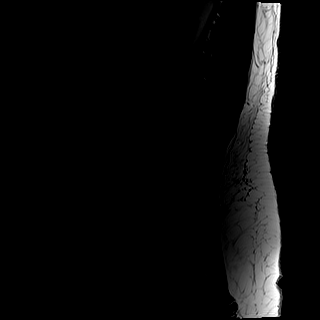
[im 4/13]
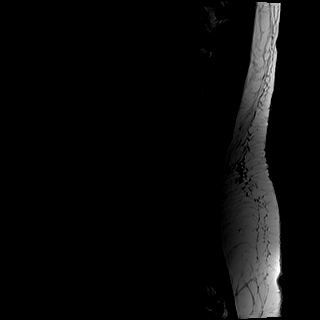
[im 7/13]
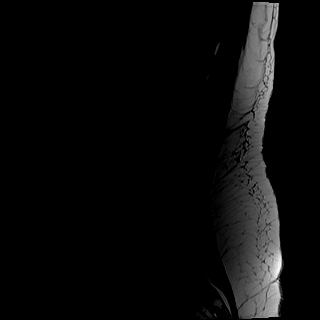
[im 10/13]
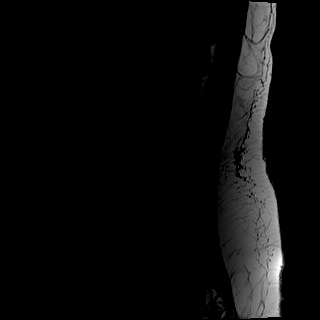
[im 13/13]
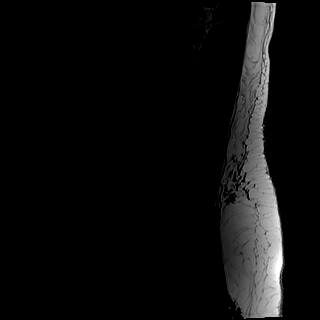

[Series 5: tirm sag · sagittal · 4.0mm · 0.55mm/px · 5 of 13 slices shown]
[im 1/13]
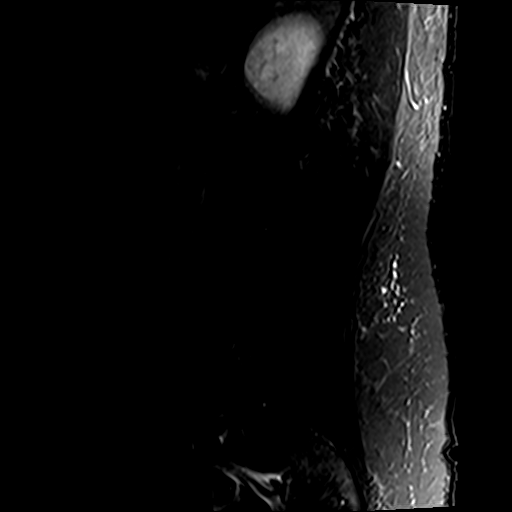
[im 4/13]
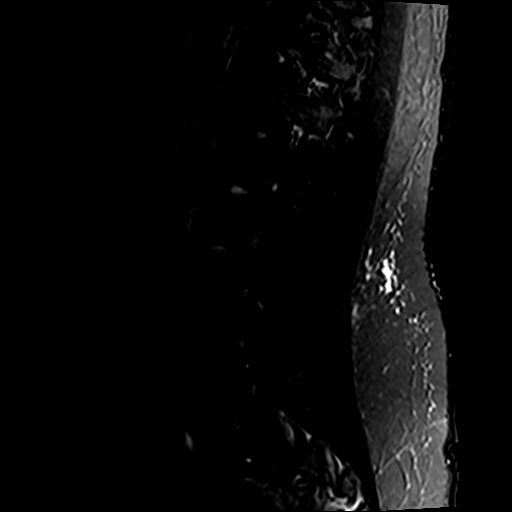
[im 7/13]
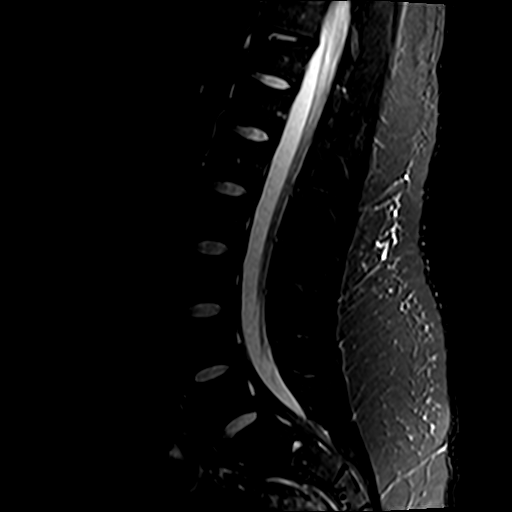
[im 10/13]
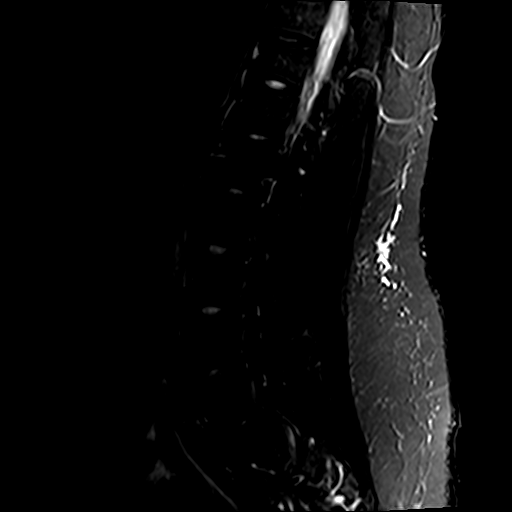
[im 13/13]
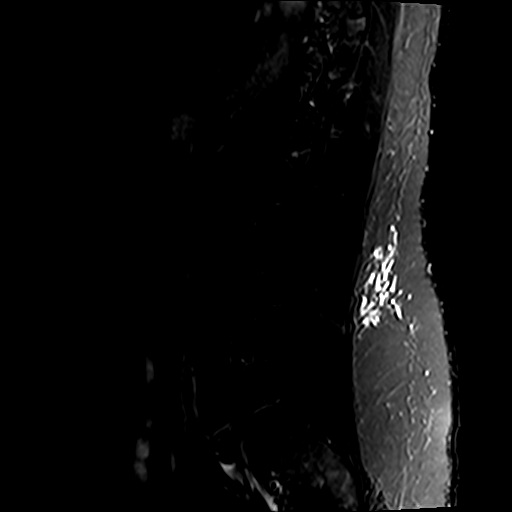

[Series 6: T1 · axial · 4.0mm · 0.78mm/px · z∈[-44,+161]mm · 14 of 39 slices shown (2 of 2)]
[im 1/39]
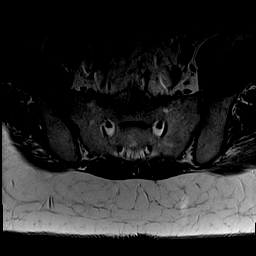
[im 3/39]
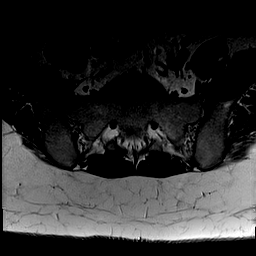
[im 6/39]
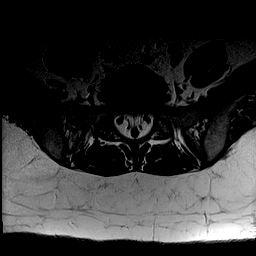
[im 8/39]
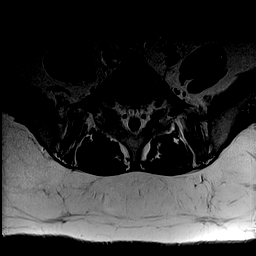
[im 11/39]
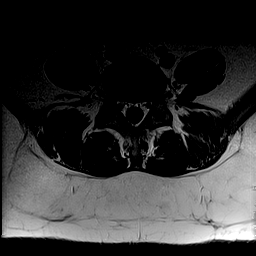
[im 13/39]
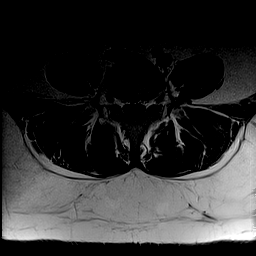
[im 16/39]
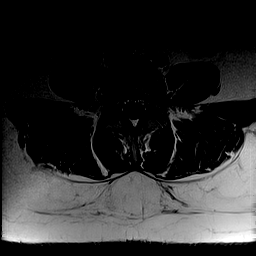
[im 18/39]
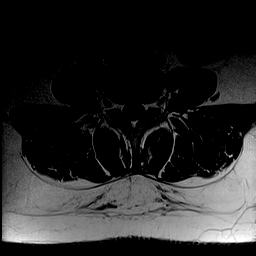
[im 21/39]
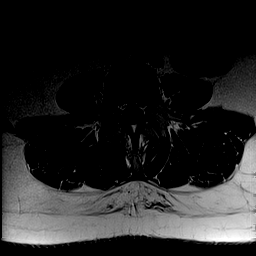
[im 23/39]
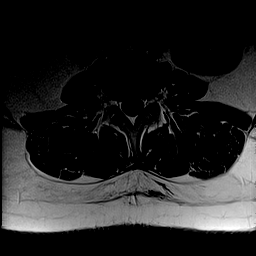
[im 26/39]
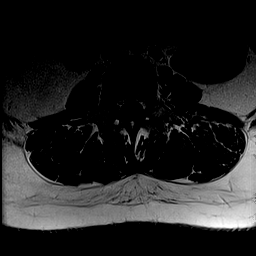
[im 28/39]
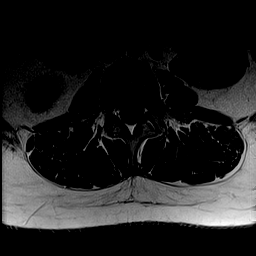
[im 33/39]
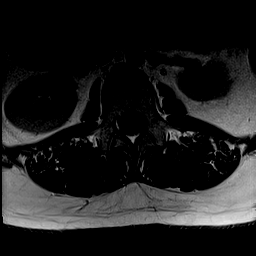
[im 39/39]
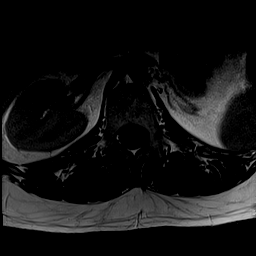

[Series 7: T2 · axial · 4.0mm · 0.78mm/px · z∈[-44,+161]mm · 16 of 39 slices shown]
[im 1/39]
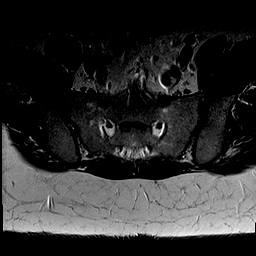
[im 3/39]
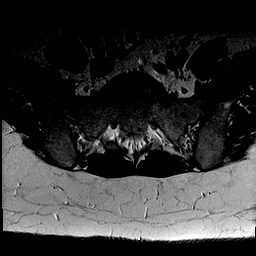
[im 6/39]
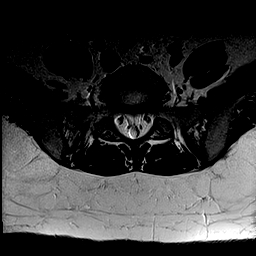
[im 8/39]
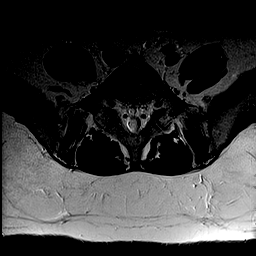
[im 11/39]
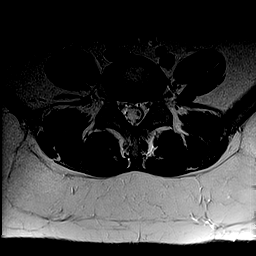
[im 13/39]
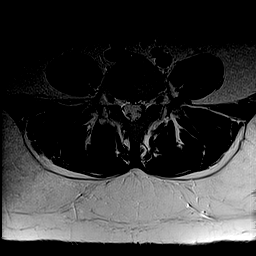
[im 16/39]
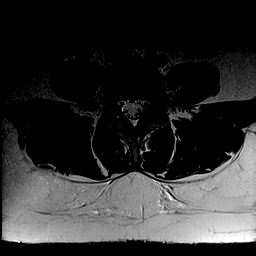
[im 18/39]
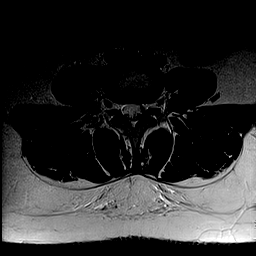
[im 21/39]
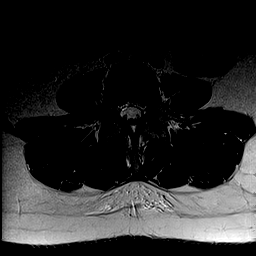
[im 23/39]
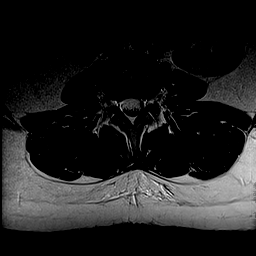
[im 26/39]
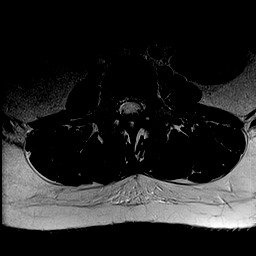
[im 28/39]
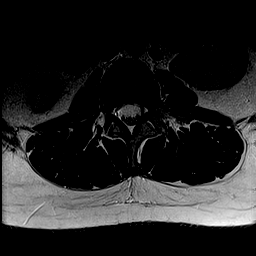
[im 31/39]
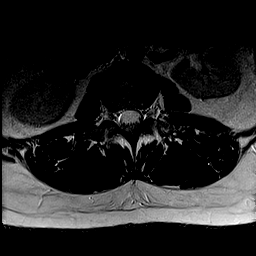
[im 33/39]
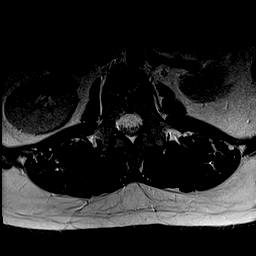
[im 36/39]
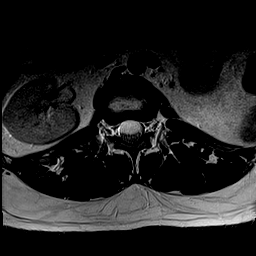
[im 39/39]
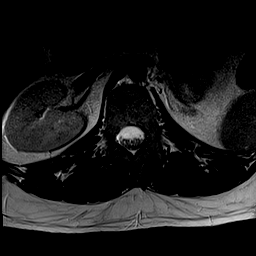

[46 of 48 positions shown; findings below may reference images not displayed]

FINDINGS: Segmentation: Transitional lumbosacral anatomy with lumbarization of
the S1 segment. Same numbering system is employed as on previous
exam.

Alignment: Chronic bilateral pars defects at S1 with associated
trace 3 mm final listhesis of S1 on S2, stable. Alignment otherwise
normal with preservation of the normal lumbar lordosis.

Vertebrae: Compression deformity involving the superior endplate of
T12 is chronic in appearance without associated marrow edema.
Superimposed chronic endplate Schmorl's node. Associated height loss
of up to 30% without bony retropulsion. Finding is new at least from
6026.

Vertebral body heights otherwise maintained. Underlying bone marrow
signal intensity normal. No discrete osseous lesions. No abnormal
marrow edema.

Conus medullaris and cauda equina: Conus extends to the T12 level.
Conus and cauda equina appear normal.

Paraspinal and other soft tissues: Paraspinous soft tissues within
normal limits. Visualized visceral structures unremarkable.

Disc levels:

No significant disc pathology seen within the lumbar spine.
Intervertebral discs are well hydrated with preserved disc height.
No disc bulge or disc protrusion. No significant facet disease. No
canal or foraminal stenosis or evidence for neural impingement.
IMPRESSION: 1. Chronic T12 compression fracture with up to 30% height loss
without bony retropulsion.
2. Transitional lumbosacral anatomy with lumbarization of the S1
segment.
3. Chronic bilateral S1 pars defects with associated trace
spondylolisthesis. No significant stenosis or neural impingement.
4. Additional levels within the lumbar spine are normal.

## 2019-04-21 ENCOUNTER — Ambulatory Visit (INDEPENDENT_AMBULATORY_CARE_PROVIDER_SITE_OTHER): Payer: Medicaid Other | Admitting: Family Medicine

## 2019-04-21 ENCOUNTER — Other Ambulatory Visit: Payer: Self-pay

## 2019-04-21 VITALS — BP 110/70 | HR 93 | Temp 97.6°F | Resp 18 | Ht 71.0 in | Wt 226.4 lb

## 2019-04-21 DIAGNOSIS — R0981 Nasal congestion: Secondary | ICD-10-CM | POA: Diagnosis not present

## 2019-04-21 DIAGNOSIS — E559 Vitamin D deficiency, unspecified: Secondary | ICD-10-CM

## 2019-04-21 DIAGNOSIS — E039 Hypothyroidism, unspecified: Secondary | ICD-10-CM

## 2019-04-21 DIAGNOSIS — Z23 Encounter for immunization: Secondary | ICD-10-CM

## 2019-04-21 DIAGNOSIS — F39 Unspecified mood [affective] disorder: Secondary | ICD-10-CM

## 2019-04-21 DIAGNOSIS — R7989 Other specified abnormal findings of blood chemistry: Secondary | ICD-10-CM

## 2019-04-21 DIAGNOSIS — M810 Age-related osteoporosis without current pathological fracture: Secondary | ICD-10-CM | POA: Diagnosis not present

## 2019-04-21 DIAGNOSIS — R739 Hyperglycemia, unspecified: Secondary | ICD-10-CM | POA: Diagnosis not present

## 2019-04-21 DIAGNOSIS — H6123 Impacted cerumen, bilateral: Secondary | ICD-10-CM

## 2019-04-21 MED ORDER — FLUTICASONE PROPIONATE 50 MCG/ACT NA SUSP: 1.0000 | Freq: Two times a day (BID) | NASAL | 1 refills | Status: AC | PRN

## 2019-04-21 NOTE — Progress Notes (Signed)
Subjective:    Patient ID: Mark Zuniga, male    DOB: [birth year], 24 y.o.   MRN: [id number]  Patient presents for Cerumen Impaction   Pt here to have his ears cleaned out- feels ears are clogged, gets a lot of wax out, he has been using q tips    My last visit was in June  2019 with him. He is now followed by endocrinology Dr. Loanne Drilling for his thyroid as well as Osteoporosis found by orthopedics after evaluation for his pars defect  He was also found to have low testosterone for his age  He has not had labs since March 2020, in setting of pandemic Has not had bone density   He admits he has not been taking meds like prescribed, he sits at home and plays video games, doesn't feel movitated. Didn't feel like meds helped him mentally in the past but he did benefit from the therapy. He would like to work some but concerned he will lose his disability and he cant work full time   Note mother also present today    Nose also constantly gets congested or drains, sneezing, no sore throat, no cough  Review Of Systems:  GEN- denies fatigue, fever, weight loss,weakness, recent illness HEENT- denies eye drainage, change in vision, +nasal discharge, CVS- denies chest pain, palpitations RESP- denies SOB, cough, wheeze ABD- denies N/V, change in stools, abd pain GU- denies dysuria, hematuria, dribbling, incontinence MSK- + joint pain, muscle aches, injury Neuro- denies headache, dizziness, syncope, seizure activity       Objective:    BP 110/70 (BP Location: Right Arm, Patient Position: Sitting, Cuff Size: Normal)   Pulse 93   Temp 97.6 F (36.4 C) (Oral)   Resp 18   Ht _0  (1.803 m)   Wt 226 lb 6.4 oz (102.7 kg)   SpO2 96%   BMI 31.58 kg/m  GEN- NAD, alert and oriented x3 HEENT- PERRL, EOMI, non injected sclera, pink conjunctiva, MMM, oropharynx clear, TM impacted wax bilat , nares congested, no sinus tenderness Neck- Supple, no thyromegaly CVS- RRR, no  murmur RESP-CTAB ABD-NABS,soft,NT,ND Psych- normal affect and mood  EXT- No edema Pulses- Radial 2+        Assessment & Plan:      Problem List Items Addressed This Visit      Unprioritized   Hyperglycemia   Relevant Orders   Hemoglobin A1c (Completed)   Hypothyroidism - Primary    Will check overdue labs and forward to his endocrinologist       Relevant Orders   CBC with Differential/Platelet (Completed)   Comprehensive metabolic panel (Completed)   TSH (Completed)   T3, free (Completed)   T4, free (Completed)   Low testosterone   Relevant Orders   Testosterone Total,Free,Bio, Males   Mood disorder (HCC)    Referral back to therapy, if we can get him motivated about his health this would improve many things for him       Relevant Orders   Ambulatory referral to Psychiatry   Osteoporosis    Other Visit Diagnoses    Vitamin D deficiency       Relevant Orders   Vitamin D, 25-hydroxy (Completed)   Need for immunization against influenza       Relevant Orders   Flu Vaccine QUAD 36+ mos IM (Completed)   Nasal congestion       flonase prn   Bilateral impacted cerumen       s/p irrigation  at bedside, advised, to avoid q tips, can use ear washing kit       Note: This dictation was prepared with Dragon dictation along with smaller phrase technology. Any transcriptional errors that result from this process are unintentional.

## 2019-04-21 NOTE — Patient Instructions (Addendum)
We will call with lab results Referral to therapy Nasal spray sent to pharmacy F/U 3 months

## 2019-04-23 ENCOUNTER — Encounter: Payer: Self-pay | Admitting: Family Medicine

## 2019-04-23 NOTE — Assessment & Plan Note (Signed)
Referral back to therapy, if we can get him motivated about his health this would improve many things for him

## 2019-04-23 NOTE — Assessment & Plan Note (Signed)
Will check overdue labs and forward to his endocrinologist

## 2019-04-24 LAB — HEMOGLOBIN A1C
Hgb A1c MFr Bld: 5.2 % of total Hgb (ref <5.7)
Mean Plasma Glucose: 103 (calc)
eAG (mmol/L): 5.7 (calc)

## 2019-04-24 LAB — CBC WITH DIFFERENTIAL/PLATELET
Absolute Monocytes: 552 cells/uL (ref 200–950)
Basophils Absolute: 46 cells/uL (ref 0–200)
Basophils Relative: 0.5 %
Eosinophils Absolute: 432 cells/uL (ref 15–500)
Eosinophils Relative: 4.7 %
HCT: 45.5 % (ref 38.5–50.0)
Hemoglobin: 15.5 g/dL (ref 13.2–17.1)
Lymphs Abs: 2061 cells/uL (ref 850–3900)
MCH: 27.8 pg (ref 27.0–33.0)
MCHC: 34.1 g/dL (ref 32.0–36.0)
MCV: 81.7 fL (ref 80.0–100.0)
MPV: 9.9 fL (ref 7.5–12.5)
Monocytes Relative: 6 %
Neutro Abs: 6109 cells/uL (ref 1500–7800)
Neutrophils Relative %: 66.4 %
Platelets: 287 10*3/uL (ref 140–400)
RBC: 5.57 10*6/uL (ref 4.20–5.80)
RDW: 12.8 % (ref 11.0–15.0)
Total Lymphocyte: 22.4 %
WBC: 9.2 10*3/uL (ref 3.8–10.8)

## 2019-04-24 LAB — COMPREHENSIVE METABOLIC PANEL
AG Ratio: 2.1 (calc) (ref 1.0–2.5)
ALT: 20 U/L (ref 9–46)
AST: 15 U/L (ref 10–40)
Albumin: 4.7 g/dL (ref 3.6–5.1)
Alkaline phosphatase (APISO): 65 U/L (ref 36–130)
BUN: 12 mg/dL (ref 7–25)
CO2: 23 mmol/L (ref 20–32)
Calcium: 9.8 mg/dL (ref 8.6–10.3)
Chloride: 103 mmol/L (ref 98–110)
Creat: 0.86 mg/dL (ref 0.60–1.35)
Globulin: 2.2 g/dL (calc) (ref 1.9–3.7)
Glucose, Bld: 92 mg/dL (ref 65–99)
Potassium: 3.9 mmol/L (ref 3.5–5.3)
Sodium: 139 mmol/L (ref 135–146)
Total Bilirubin: 0.5 mg/dL (ref 0.2–1.2)
Total Protein: 6.9 g/dL (ref 6.1–8.1)

## 2019-04-24 LAB — TESTOSTERONE TOTAL,FREE,BIO, MALES
Albumin: 4.7 g/dL (ref 3.6–5.1)
Sex Hormone Binding: 10 nmol/L (ref 10–50)
Testosterone: 132 ng/dL — ABNORMAL LOW (ref 250–827)

## 2019-04-24 LAB — T3, FREE: T3, Free: 4 pg/mL (ref 2.3–4.2)

## 2019-04-24 LAB — TSH: TSH: 5.36 mIU/L — ABNORMAL HIGH (ref 0.40–4.50)

## 2019-04-24 LAB — VITAMIN D 25 HYDROXY (VIT D DEFICIENCY, FRACTURES): Vit D, 25-Hydroxy: 12 ng/mL — ABNORMAL LOW (ref 30–100)

## 2019-04-24 LAB — T4, FREE: Free T4: 1.3 ng/dL (ref 0.8–1.8)

## 2019-04-25 ENCOUNTER — Telehealth: Payer: Self-pay

## 2019-04-25 NOTE — Telephone Encounter (Signed)
-----   Message from Romero Belling, MD sent at 04/24/2019  6:13 PM EST ----- please contact patient: F/u is due ----- Message ----- From: Salley Scarlet, MD Sent: 04/24/2019   5:09 PM EST To: Romero Belling, MD, Durwin Nora Six, LPN  Call pt, thyroid is off as expected Vitamin D is very low Testosterone also low Take his thyroid medication every day  Send vitamin D 50,000 IU q weekly x 12 weeks  I have CC his endocrinologist for further recommendations

## 2019-04-25 NOTE — Telephone Encounter (Signed)
Per Dr. Everardo All, routing this message to the front desk for scheduling purposes.

## 2019-04-26 ENCOUNTER — Other Ambulatory Visit: Payer: Self-pay

## 2019-04-26 NOTE — Telephone Encounter (Signed)
Spoke with the patient and he stated he will call back to schedule once he has had a chance to speak to his mom

## 2019-04-26 NOTE — Telephone Encounter (Signed)
Patient called back and scheduled follow up appointment for 04/28/19

## 2019-04-27 ENCOUNTER — Other Ambulatory Visit: Payer: Self-pay | Admitting: *Deleted

## 2019-04-27 MED ORDER — VITAMIN D (ERGOCALCIFEROL) 1.25 MG (50000 UNIT) PO CAPS: 50000.0000 [IU] | ORAL_CAPSULE | ORAL | 0 refills | Status: DC

## 2019-04-28 ENCOUNTER — Other Ambulatory Visit: Payer: Self-pay

## 2019-04-28 ENCOUNTER — Ambulatory Visit (INDEPENDENT_AMBULATORY_CARE_PROVIDER_SITE_OTHER): Payer: Medicaid Other | Admitting: Endocrinology

## 2019-04-28 ENCOUNTER — Encounter: Payer: Self-pay | Admitting: Endocrinology

## 2019-04-28 VITALS — BP 120/80 | HR 96 | Ht 71.0 in | Wt 226.6 lb

## 2019-04-28 DIAGNOSIS — E559 Vitamin D deficiency, unspecified: Secondary | ICD-10-CM | POA: Insufficient documentation

## 2019-04-28 DIAGNOSIS — R7989 Other specified abnormal findings of blood chemistry: Secondary | ICD-10-CM | POA: Diagnosis not present

## 2019-04-28 MED ORDER — CLOMIPHENE CITRATE 50 MG PO TABS: ORAL_TABLET | ORAL | 11 refills | Status: DC

## 2019-04-28 NOTE — Patient Instructions (Addendum)
I have sent a prescription to your pharmacy, to resume the clomiphene.   In 6 weeks, please recheck the testosterone and vitamin-D.   When we get these back to normal, we'll recheck the bone density.  Please come back for a follow-up appointment in 6 months

## 2019-04-28 NOTE — Progress Notes (Signed)
Subjective:    Patient ID: Mark Zuniga, male    DOB: [birth year], 24 y.o.   MRN: [id number]  HPI Pt returns for f/u of osteoporosis:  Dx'ed: 2019 Secondary cause: low testosterone, vit-D def and smoking (quit 2017).   Fractures: traumatic comp fxs of L-5 and T-12.   Past rx: none Current rx: none Last DEXA result (2019): worst T-score was -3.7 (spine) Other: he takes weekly high-dose ergocalciferol; he is disabled.  Interval hx: for vit-D def, he takes high-dose ergocalciferol since mid-2019, Q week.  He has intermitt moderate cramps in the legs, but no assoc numbness.   Pt is also here for for hypothyroidism (dx'ed 2007; he has been on prescribed thyroid hormone therapy since then; he has never had thyroid imaging).   Pt has hypogonadism (dx'ed 2019; he had puberty at the normal age; he has no biological children, but would like to maintain that option for the future; he was rx'ed clomid in early 2020; MRI (2015) made no mention is made of the pituitary).  He has not recently taken clomid.  pt states he feels better in general.   Past Medical History:  Diagnosis Date  . Migraine   . Thyroid disease     No past surgical history on file.  Social History   Socioeconomic History  . Marital status: Single    Spouse name: Not on file  . Number of children: Not on file  . Years of education: Not on file  . Highest education level: Not on file  Occupational History  . Not on file  Tobacco Use  . Smoking status: Former Smoker    Packs/day: 0.75    Types: Cigarettes  . Smokeless tobacco: Former Systems developer  . Tobacco comment: quit 8/14/21015  Substance and Sexual Activity  . Alcohol use: Not Currently    Alcohol/week: 0.0 standard drinks  . Drug use: Not Currently    Types: Marijuana  . Sexual activity: Not Currently  Other Topics Concern  . Not on file  Social History Narrative  . Not on file   Social Determinants of Health   Financial Resource Strain:   . Difficulty of  Paying Living Expenses: Not on file  Food Insecurity:   . Worried About Charity fundraiser in the Last Year: Not on file  . Ran Out of Food in the Last Year: Not on file  Transportation Needs:   . Lack of Transportation (Medical): Not on file  . Lack of Transportation (Non-Medical): Not on file  Physical Activity:   . Days of Exercise per Week: Not on file  . Minutes of Exercise per Session: Not on file  Stress:   . Feeling of Stress : Not on file  Social Connections:   . Frequency of Communication with Friends and Family: Not on file  . Frequency of Social Gatherings with Friends and Family: Not on file  . Attends Religious Services: Not on file  . Active Member of Clubs or Organizations: Not on file  . Attends Archivist Meetings: Not on file  . Marital Status: Not on file  Intimate Partner Violence:   . Fear of Current or Ex-Partner: Not on file  . Emotionally Abused: Not on file  . Physically Abused: Not on file  . Sexually Abused: Not on file    Current Outpatient Medications on File Prior to Visit  Medication Sig Dispense Refill  . fluticasone (FLONASE) 50 MCG/ACT nasal spray Place 1 spray into both nostrils  2 (two) times daily as needed for allergies or rhinitis. 16 g 1   No current facility-administered medications on file prior to visit.    Allergies  Allergen Reactions  . Sulfa Drugs Cross Reactors Other (See Comments)    unknown    Family History  Problem Relation Age of Onset  . Osteoporosis Maternal Grandmother     BP 120/80 (BP Location: Left Arm, Patient Position: Sitting, Cuff Size: Large)   Pulse 96   Ht 5\' 11"  (1.803 m)   Wt 226 lb 9.6 oz (102.8 kg)   SpO2 99%   BMI 31.60 kg/m    Review of Systems Denies ED sxs.  Denies falls.      Objective:   Physical Exam VITAL SIGNS:  See vs page GENERAL: no distress CHEST WALL: no kyphosis GAIT: normal and steady  Lab Results  Component Value Date   TSH 5.36 (H) 04/21/2019   Lab  Results  Component Value Date   TESTOSTERONE 132 (L) 04/21/2019       Assessment & Plan:  Hypogonadism: worse off rx Hypothyroidism: he needs increased rx. I have sent a prescription to your pharmacy, to increase synthroid Osteoporosis: we'll recheck DEXA when labs are better Vit-D def: now on increased rx.   Patient Instructions  I have sent a prescription to your pharmacy, to resume the clomiphene.   In 6 weeks, please recheck the testosterone and vitamin-D.   When we get these back to normal, we'll recheck the bone density.  Please come back for a follow-up appointment in 6 months

## 2019-04-29 MED ORDER — LEVOTHYROXINE SODIUM 112 MCG PO TABS: 112.0000 ug | ORAL_TABLET | Freq: Every day | ORAL | 3 refills | Status: AC

## 2019-05-14 ENCOUNTER — Other Ambulatory Visit: Payer: Self-pay | Admitting: Endocrinology

## 2019-06-13 DIAGNOSIS — F411 Generalized anxiety disorder: Secondary | ICD-10-CM | POA: Diagnosis not present

## 2019-06-13 DIAGNOSIS — F331 Major depressive disorder, recurrent, moderate: Secondary | ICD-10-CM | POA: Diagnosis not present

## 2019-06-23 DIAGNOSIS — F331 Major depressive disorder, recurrent, moderate: Secondary | ICD-10-CM | POA: Diagnosis not present

## 2019-06-23 DIAGNOSIS — F411 Generalized anxiety disorder: Secondary | ICD-10-CM | POA: Diagnosis not present

## 2019-06-30 DIAGNOSIS — F411 Generalized anxiety disorder: Secondary | ICD-10-CM | POA: Diagnosis not present

## 2019-06-30 DIAGNOSIS — F331 Major depressive disorder, recurrent, moderate: Secondary | ICD-10-CM | POA: Diagnosis not present

## 2019-07-07 DIAGNOSIS — F331 Major depressive disorder, recurrent, moderate: Secondary | ICD-10-CM | POA: Diagnosis not present

## 2019-07-07 DIAGNOSIS — F411 Generalized anxiety disorder: Secondary | ICD-10-CM | POA: Diagnosis not present

## 2019-07-17 DIAGNOSIS — F411 Generalized anxiety disorder: Secondary | ICD-10-CM | POA: Diagnosis not present

## 2019-07-17 DIAGNOSIS — F331 Major depressive disorder, recurrent, moderate: Secondary | ICD-10-CM | POA: Diagnosis not present

## 2019-07-21 ENCOUNTER — Ambulatory Visit (INDEPENDENT_AMBULATORY_CARE_PROVIDER_SITE_OTHER): Payer: Medicaid Other | Admitting: Family Medicine

## 2019-07-21 ENCOUNTER — Encounter: Payer: Self-pay | Admitting: Family Medicine

## 2019-07-21 ENCOUNTER — Other Ambulatory Visit: Payer: Self-pay

## 2019-07-21 VITALS — BP 110/64 | HR 80 | Temp 98.2°F | Resp 16 | Ht 71.0 in | Wt 236.0 lb

## 2019-07-21 DIAGNOSIS — E669 Obesity, unspecified: Secondary | ICD-10-CM

## 2019-07-21 DIAGNOSIS — Z13220 Encounter for screening for lipoid disorders: Secondary | ICD-10-CM

## 2019-07-21 DIAGNOSIS — E559 Vitamin D deficiency, unspecified: Secondary | ICD-10-CM | POA: Diagnosis not present

## 2019-07-21 DIAGNOSIS — R7989 Other specified abnormal findings of blood chemistry: Secondary | ICD-10-CM | POA: Diagnosis not present

## 2019-07-21 DIAGNOSIS — L409 Psoriasis, unspecified: Secondary | ICD-10-CM | POA: Diagnosis not present

## 2019-07-21 DIAGNOSIS — E039 Hypothyroidism, unspecified: Secondary | ICD-10-CM | POA: Diagnosis not present

## 2019-07-21 DIAGNOSIS — F39 Unspecified mood [affective] disorder: Secondary | ICD-10-CM | POA: Diagnosis not present

## 2019-07-21 MED ORDER — TRIAMCINOLONE ACETONIDE 0.1 % EX CREA: 1.0000 "application " | TOPICAL_CREAM | Freq: Two times a day (BID) | CUTANEOUS | 2 refills | Status: AC

## 2019-07-21 NOTE — Progress Notes (Signed)
   Subjective:    Patient ID: Mark Zuniga, male    DOB: [birth year], 24 y.o.   MRN: [id number]  Patient presents for Follow-up (is fasting)  Pt here to f/u chronic medical problems., he has not been consistent with thyroid medication, but states past month he has done better  He is still taking clomid most days, was due for repeat labs from endocrine last month, he has not had these done  He is taking his vitamin D     Mood disorder- he has started therapy online, thinks it will help withhis mood and motivation. He doesn't want medications, " they never worked" in the past per pt     He has psorisia and eczema worse on face , needs steroid cream refilled,       Review Of Systems:  GEN- denies fatigue, fever, weight loss,weakness, recent illness HEENT- denies eye drainage, change in vision, nasal discharge, CVS- denies chest pain, palpitations RESP- denies SOB, cough, wheeze ABD- denies N/V, change in stools, abd pain GU- denies dysuria, hematuria, dribbling, incontinence MSK- + joint pain, muscle aches, injury Neuro- denies headache, dizziness, syncope, seizure activity       Objective:    BP 110/64   Pulse 80   Temp 98.2 F (36.8 C) (Temporal)   Resp 16   Ht 5\' 11"  (1.803 m)   Wt 236 lb (107 kg)   SpO2 98%   BMI 32.92 kg/m  GEN- NAD, alert and oriented x3 HEENT- PERRL, EOMI, non injected sclera, pink conjunctiva, MMM, oropharynx clear Neck- Supple, no thyromegaly CVS- RRR, no murmur RESP-CTAB Skin- mild psoriasis lesions forehead, cheeks EXT- No edema Pulses- Radial  2+        Assessment & Plan:      Problem List Items Addressed This Visit      Unprioritized   Class 1 obesity   Hypothyroidism - Primary    Recheck TFT, not compliant with meds Weight up 10lbs, he is not very active Does not watch diet       Relevant Orders   TSH (Completed)   T3, free (Completed)   Basic metabolic panel (Completed)   Low testosterone   Relevant Orders   CBC with Differential/Platelet (Completed)   Testosterone (Completed)   Mood disorder (HCC)    Continue with psychotherapist Hopefully he will start to improve his motivation to care for himself, consider getting part time job or going back to school      Psoriasis    TAC cream refilled, was given by derm in past works well      Vitamin D deficiency   Relevant Orders   Vitamin D, 25-hydroxy (Completed)    Other Visit Diagnoses    Screening cholesterol level       Relevant Orders   Lipid panel (Completed)      Note: This dictation was prepared with Dragon dictation along with smaller phrase technology. Any transcriptional errors that result from this process are unintentional.

## 2019-07-21 NOTE — Patient Instructions (Signed)
F/U 3-4 months for Physical  

## 2019-07-22 DIAGNOSIS — E66811 Obesity, class 1: Secondary | ICD-10-CM | POA: Insufficient documentation

## 2019-07-22 DIAGNOSIS — E669 Obesity, unspecified: Secondary | ICD-10-CM | POA: Insufficient documentation

## 2019-07-22 LAB — CBC WITH DIFFERENTIAL/PLATELET
Absolute Monocytes: 966 cells/uL — ABNORMAL HIGH (ref 200–950)
Basophils Absolute: 84 cells/uL (ref 0–200)
Basophils Relative: 0.6 %
Eosinophils Absolute: 658 cells/uL — ABNORMAL HIGH (ref 15–500)
Eosinophils Relative: 4.7 %
HCT: 48.6 % (ref 38.5–50.0)
Hemoglobin: 16.2 g/dL (ref 13.2–17.1)
Lymphs Abs: 4242 cells/uL — ABNORMAL HIGH (ref 850–3900)
MCH: 28.1 pg (ref 27.0–33.0)
MCHC: 33.3 g/dL (ref 32.0–36.0)
MCV: 84.2 fL (ref 80.0–100.0)
MPV: 10 fL (ref 7.5–12.5)
Monocytes Relative: 6.9 %
Neutro Abs: 8050 cells/uL — ABNORMAL HIGH (ref 1500–7800)
Neutrophils Relative %: 57.5 %
Platelets: 298 10*3/uL (ref 140–400)
RBC: 5.77 10*6/uL (ref 4.20–5.80)
RDW: 12.8 % (ref 11.0–15.0)
Total Lymphocyte: 30.3 %
WBC: 14 10*3/uL — ABNORMAL HIGH (ref 3.8–10.8)

## 2019-07-22 LAB — LIPID PANEL
Cholesterol: 180 mg/dL (ref <200)
HDL: 33 mg/dL — ABNORMAL LOW (ref >=40)
LDL Cholesterol (Calc): 118 mg/dL (calc) — ABNORMAL HIGH
Non-HDL Cholesterol (Calc): 147 mg/dL (calc) — ABNORMAL HIGH (ref <130)
Total CHOL/HDL Ratio: 5.5 (calc) — ABNORMAL HIGH (ref <5.0)
Triglycerides: 177 mg/dL — ABNORMAL HIGH (ref <150)

## 2019-07-22 LAB — BASIC METABOLIC PANEL
BUN: 13 mg/dL (ref 7–25)
CO2: 22 mmol/L (ref 20–32)
Calcium: 9.5 mg/dL (ref 8.6–10.3)
Chloride: 102 mmol/L (ref 98–110)
Creat: 0.88 mg/dL (ref 0.60–1.35)
Glucose, Bld: 104 mg/dL — ABNORMAL HIGH (ref 65–99)
Potassium: 4 mmol/L (ref 3.5–5.3)
Sodium: 139 mmol/L (ref 135–146)

## 2019-07-22 LAB — TSH: TSH: 4.95 mIU/L — ABNORMAL HIGH (ref 0.40–4.50)

## 2019-07-22 LAB — VITAMIN D 25 HYDROXY (VIT D DEFICIENCY, FRACTURES): Vit D, 25-Hydroxy: 17 ng/mL — ABNORMAL LOW (ref 30–100)

## 2019-07-22 LAB — T3, FREE: T3, Free: 4.1 pg/mL (ref 2.3–4.2)

## 2019-07-22 LAB — TESTOSTERONE: Testosterone: 184 ng/dL — ABNORMAL LOW (ref 250–827)

## 2019-07-22 NOTE — Assessment & Plan Note (Signed)
Recheck TFT, not compliant with meds Weight up 10lbs, he is not very active Does not watch diet

## 2019-07-22 NOTE — Assessment & Plan Note (Signed)
Continue with psychotherapist Hopefully he will start to improve his motivation to care for himself, consider getting part time job or going back to school

## 2019-07-22 NOTE — Assessment & Plan Note (Signed)
TAC cream refilled, was given by derm in past works well

## 2019-07-24 ENCOUNTER — Other Ambulatory Visit: Payer: Self-pay | Admitting: *Deleted

## 2019-07-24 DIAGNOSIS — E039 Hypothyroidism, unspecified: Secondary | ICD-10-CM

## 2019-07-24 DIAGNOSIS — E559 Vitamin D deficiency, unspecified: Secondary | ICD-10-CM

## 2019-07-24 DIAGNOSIS — F39 Unspecified mood [affective] disorder: Secondary | ICD-10-CM

## 2019-07-24 DIAGNOSIS — E669 Obesity, unspecified: Secondary | ICD-10-CM

## 2019-07-24 DIAGNOSIS — Z13220 Encounter for screening for lipoid disorders: Secondary | ICD-10-CM

## 2019-07-24 DIAGNOSIS — R7989 Other specified abnormal findings of blood chemistry: Secondary | ICD-10-CM

## 2019-07-25 ENCOUNTER — Other Ambulatory Visit: Payer: Self-pay

## 2019-07-28 ENCOUNTER — Ambulatory Visit (INDEPENDENT_AMBULATORY_CARE_PROVIDER_SITE_OTHER): Payer: Medicaid Other | Admitting: Endocrinology

## 2019-07-28 ENCOUNTER — Other Ambulatory Visit: Payer: Self-pay

## 2019-07-28 ENCOUNTER — Encounter: Payer: Self-pay | Admitting: Endocrinology

## 2019-07-28 VITALS — BP 116/80 | HR 92 | Ht 71.0 in | Wt 232.0 lb

## 2019-07-28 DIAGNOSIS — R7989 Other specified abnormal findings of blood chemistry: Secondary | ICD-10-CM | POA: Diagnosis not present

## 2019-07-28 MED ORDER — CLOMIPHENE CITRATE 50 MG PO TABS: ORAL_TABLET | ORAL | 11 refills | Status: AC

## 2019-07-28 MED ORDER — VITAMIN D (ERGOCALCIFEROL) 1.25 MG (50000 UNIT) PO CAPS: 50000.0000 [IU] | ORAL_CAPSULE | ORAL | 5 refills | Status: DC

## 2019-07-28 NOTE — Progress Notes (Signed)
Subjective:    Patient ID: Mark Zuniga, male    DOB: 06/17/1995, 24 y.o.   MRN: [id number]  HPI Pt returns for f/u of osteoporosis:  Dx'ed: 2019 Secondary cause: low testosterone, vit-D def and smoking (quit 2017).   Fractures: traumatic comp fxs of L-5 and T-12.   Past rx: none Current rx: none Last DEXA result (2019): worst T-score was -3.7 (spine) Other: he takes weekly high-dose ergocalciferol; he is disabled.  Interval hx: for vit-D def, he takes high-dose ergocalciferol since mid-2019, Q week.  He has intermitt moderate cramps in the legs, but no assoc numbness.   Pt is also here for for hypothyroidism (dx'ed 2007; he has been on prescribed thyroid hormone therapy since then; he has never had thyroid imaging).  Pt says he takes levothyroxine as rx'ed.   Pt has idiopathic central hypogonadism (dx'ed 2019; he had puberty at the normal age; he has no biological children, but would like to maintain that option for the future; he was rx'ed clomid in early 2020; MRI (2015) made no mention of the pituitary).  He has not recently taken clomid.  pt states he feels well in general.   Past Medical History:  Diagnosis Date  . Migraine   . Thyroid disease     No past surgical history on file.  Social History   Socioeconomic History  . Marital status: Single    Spouse name: Not on file  . Number of children: Not on file  . Years of education: Not on file  . Highest education level: Not on file  Occupational History  . Not on file  Tobacco Use  . Smoking status: Former Smoker    Packs/day: 0.75    Types: Cigarettes  . Smokeless tobacco: Former Neurosurgeon  . Tobacco comment: quit 8/14/21015  Substance and Sexual Activity  . Alcohol use: Not Currently    Alcohol/week: 0.0 standard drinks  . Drug use: Not Currently    Types: Marijuana  . Sexual activity: Not Currently  Other Topics Concern  . Not on file  Social History Narrative  . Not on file   Social Determinants of  Health   Financial Resource Strain:   . Difficulty of Paying Living Expenses:   Food Insecurity:   . Worried About Programme researcher, broadcasting/film/video in the Last Year:   . Barista in the Last Year:   Transportation Needs:   . Freight forwarder (Medical):   Marland Kitchen Lack of Transportation (Non-Medical):   Physical Activity:   . Days of Exercise per Week:   . Minutes of Exercise per Session:   Stress:   . Feeling of Stress :   Social Connections:   . Frequency of Communication with Friends and Family:   . Frequency of Social Gatherings with Friends and Family:   . Attends Religious Services:   . Active Member of Clubs or Organizations:   . Attends Banker Meetings:   Marland Kitchen Marital Status:   Intimate Partner Violence:   . Fear of Current or Ex-Partner:   . Emotionally Abused:   Marland Kitchen Physically Abused:   . Sexually Abused:     Current Outpatient Medications on File Prior to Visit  Medication Sig Dispense Refill  . fluticasone (FLONASE) 50 MCG/ACT nasal spray Place 1 spray into both nostrils 2 (two) times daily as needed for allergies or rhinitis. 16 g 1  . levothyroxine (SYNTHROID) 112 MCG tablet Take 1 tablet (112 mcg total) by mouth  daily before breakfast. 90 tablet 3  . triamcinolone cream (KENALOG) 0.1 % Apply 1 application topically 2 (two) times daily. 45 g 2   No current facility-administered medications on file prior to visit.    Allergies  Allergen Reactions  . Sulfa Drugs Cross Reactors Other (See Comments)    unknown    Family History  Problem Relation Age of Onset  . Osteoporosis Maternal Grandmother     BP 116/80   Pulse 92   Ht 5\' 11"  (1.803 m)   Wt 232 lb (105.2 kg)   SpO2 97%   BMI 32.36 kg/m    Review of Systems Denies ED sxs.     Objective:   Physical Exam VITAL SIGNS:  See vs page GENERAL: no distress GAIT: normal and steady CV: no leg edema.       Assessment & Plan:  Vit-D def: noncompliant with rx Hypogonadism: noncompliant with  rx Osteoporosis: we'll plan to recheck 7/21  Patient Instructions  I have sent a prescription to your pharmacy, to resume the clomiphene and vitamin-D. Please come back for a follow-up appointment in 3 months.

## 2019-07-28 NOTE — Patient Instructions (Addendum)
I have sent a prescription to your pharmacy, to resume the clomiphene and vitamin-D. Please come back for a follow-up appointment in 3 months.

## 2019-10-26 ENCOUNTER — Ambulatory Visit: Payer: Medicaid Other | Admitting: Endocrinology

## 2019-10-27 ENCOUNTER — Ambulatory Visit: Payer: Medicaid Other | Admitting: Endocrinology

## 2019-11-21 ENCOUNTER — Encounter: Payer: Medicaid Other | Admitting: Family Medicine

## 2020-03-05 ENCOUNTER — Ambulatory Visit (INDEPENDENT_AMBULATORY_CARE_PROVIDER_SITE_OTHER): Payer: Medicaid Other | Admitting: Family Medicine

## 2020-03-05 ENCOUNTER — Other Ambulatory Visit: Payer: Self-pay

## 2020-03-05 VITALS — BP 126/64 | HR 96 | Temp 98.2°F | Resp 16 | Ht 71.0 in | Wt 229.4 lb

## 2020-03-05 DIAGNOSIS — M810 Age-related osteoporosis without current pathological fracture: Secondary | ICD-10-CM | POA: Diagnosis not present

## 2020-03-05 DIAGNOSIS — M4306 Spondylolysis, lumbar region: Secondary | ICD-10-CM

## 2020-03-05 DIAGNOSIS — R7989 Other specified abnormal findings of blood chemistry: Secondary | ICD-10-CM | POA: Diagnosis not present

## 2020-03-05 DIAGNOSIS — R739 Hyperglycemia, unspecified: Secondary | ICD-10-CM | POA: Diagnosis not present

## 2020-03-05 DIAGNOSIS — E669 Obesity, unspecified: Secondary | ICD-10-CM

## 2020-03-05 DIAGNOSIS — E559 Vitamin D deficiency, unspecified: Secondary | ICD-10-CM

## 2020-03-05 DIAGNOSIS — H6123 Impacted cerumen, bilateral: Secondary | ICD-10-CM

## 2020-03-05 DIAGNOSIS — E781 Pure hyperglyceridemia: Secondary | ICD-10-CM

## 2020-03-05 DIAGNOSIS — L309 Dermatitis, unspecified: Secondary | ICD-10-CM

## 2020-03-05 DIAGNOSIS — E039 Hypothyroidism, unspecified: Secondary | ICD-10-CM

## 2020-03-05 NOTE — Patient Instructions (Signed)
Schedule with your endocrinologist Referral to [facility]  F/U pending results

## 2020-03-05 NOTE — Progress Notes (Signed)
Subjective:    Patient ID: Mark Zuniga, male    DOB: 1995-05-17, 24 y.o.   MRN: [id number]  Patient presents for Hearing Loss (pt reports prone to wax buildup and feels it is a problem as he is losing some hearing sensativity in both ears, possibly due for labs )  Pt here to f/u medications  he is trying to et disability re-evaluation  He is frustrated they were considering stopping his disability due to his chronic back pain and he states other issues.  He states that he has been stressed.  He has not followed up with this endocrinologist he has not been taking his medications.  He did start taking the thyroid medicine more consistently a couple weeks ago.   Vitamin D  - not taking     He is concerned about wax buildup- feels like he cant hear as well out of right ear  Needs eczema cream refilled  Review Of Systems:  GEN- denies fatigue, fever, weight loss,weakness, recent illness HEENT- denies eye drainage, change in vision, nasal discharge, CVS- denies chest pain, palpitations RESP- denies SOB, cough, wheeze ABD- denies N/V, change in stools, abd pain GU- denies dysuria, hematuria, dribbling, incontinence MSK- + joint pain, muscle aches, injury Neuro- denies headache, dizziness, syncope, seizure activity       Objective:    BP 126/64   Pulse 96   Temp 98.2 F (36.8 C) (Temporal)   Resp 16   Ht 5\' 11"  (1.803 m)   Wt 229 lb 6.4 oz (104.1 kg)   SpO2 97%   BMI 31.99 kg/m  GEN- NAD, alert and oriented x3 HEENT- PERRL, EOMI, non injected sclera, pink conjunctiva, MMM,  bilat  CANAL impacted wax s/p removal TM in tact,  Neck- Supple, no thyromegaly CVS- RRR, no murmur RESP-CTAB ABD-NABS,soft,NT,ND Psych normal affect and mood Skin eczematous spot on ear, bilat cheeks EXT- No edema Pulses- Radial, DP- 2+        Assessment & Plan:  Declined flu shot and Covid shot   Problem List Items Addressed This Visit      Unprioritized   Class 1 obesity    Relevant Orders   Hemoglobin A1c (Completed)   Eczema of face    Refill triamcinolone cream for his eczema/dermatitis.      Hyperglycemia   Relevant Orders   Hemoglobin A1c (Completed)   Hypothyroidism - Primary    UnFortunately he continues to have compliance issues with his medications and appointments.  We will check his labs today. Already been referred to endocrinology has not followed up with him either.  He states that he is lasted another appointment to discuss his testosterone and his osteoporosis.  I will go ahead and draw the labs today will need today is I am not convinced that he is going to follow-up.      Relevant Orders   TSH (Completed)   T3, free (Completed)   T4, free (Completed)   Low testosterone   Relevant Orders   Testosterone (Completed)   Osteoporosis   Pars defect of lumbar spine    Chronic back pain which he states keeps him from working and doing activities.      Vitamin D deficiency   Relevant Orders   Vitamin D, 25-hydroxy (Completed)    Other Visit Diagnoses    Hypertriglyceridemia       Relevant Orders   CBC with Differential/Platelet (Completed)   Comprehensive metabolic panel (Completed)   Lipid panel (Completed)  Bilateral impacted cerumen          Note: This dictation was prepared with Dragon dictation along with smaller phrase technology. Any transcriptional errors that result from this process are unintentional.

## 2020-03-06 ENCOUNTER — Encounter: Payer: Self-pay | Admitting: Family Medicine

## 2020-03-06 LAB — CBC WITH DIFFERENTIAL/PLATELET
Absolute Monocytes: 668 cells/uL (ref 200–950)
Basophils Absolute: 74 cells/uL (ref 0–200)
Basophils Relative: 0.7 %
Eosinophils Absolute: 424 cells/uL (ref 15–500)
Eosinophils Relative: 4 %
HCT: 45.1 % (ref 38.5–50.0)
Hemoglobin: 15.4 g/dL (ref 13.2–17.1)
Lymphs Abs: 2173 cells/uL (ref 850–3900)
MCH: 27.6 pg (ref 27.0–33.0)
MCHC: 34.1 g/dL (ref 32.0–36.0)
MCV: 80.8 fL (ref 80.0–100.0)
MPV: 10 fL (ref 7.5–12.5)
Monocytes Relative: 6.3 %
Neutro Abs: 7261 cells/uL (ref 1500–7800)
Neutrophils Relative %: 68.5 %
Platelets: 314 10*3/uL (ref 140–400)
RBC: 5.58 10*6/uL (ref 4.20–5.80)
RDW: 12.5 % (ref 11.0–15.0)
Total Lymphocyte: 20.5 %
WBC: 10.6 10*3/uL (ref 3.8–10.8)

## 2020-03-06 LAB — T4, FREE: Free T4: 1.3 ng/dL (ref 0.8–1.8)

## 2020-03-06 LAB — COMPREHENSIVE METABOLIC PANEL
AG Ratio: 2.8 (calc) — ABNORMAL HIGH (ref 1.0–2.5)
ALT: 23 U/L (ref 9–46)
AST: 18 U/L (ref 10–40)
Albumin: 4.8 g/dL (ref 3.6–5.1)
Alkaline phosphatase (APISO): 69 U/L (ref 36–130)
BUN: 11 mg/dL (ref 7–25)
CO2: 21 mmol/L (ref 20–32)
Calcium: 9.2 mg/dL (ref 8.6–10.3)
Chloride: 105 mmol/L (ref 98–110)
Creat: 0.72 mg/dL (ref 0.60–1.35)
Globulin: 1.7 g/dL (calc) — ABNORMAL LOW (ref 1.9–3.7)
Glucose, Bld: 97 mg/dL (ref 65–99)
Potassium: 3.9 mmol/L (ref 3.5–5.3)
Sodium: 138 mmol/L (ref 135–146)
Total Bilirubin: 0.7 mg/dL (ref 0.2–1.2)
Total Protein: 6.5 g/dL (ref 6.1–8.1)

## 2020-03-06 LAB — HEMOGLOBIN A1C
Hgb A1c MFr Bld: 5.2 % of total Hgb (ref <5.7)
Mean Plasma Glucose: 103 mg/dL
eAG (mmol/L): 5.7 mmol/L

## 2020-03-06 LAB — TSH: TSH: 2.61 mIU/L (ref 0.40–4.50)

## 2020-03-06 LAB — LIPID PANEL
Cholesterol: 146 mg/dL (ref <200)
HDL: 32 mg/dL — ABNORMAL LOW (ref >=40)
LDL Cholesterol (Calc): 88 mg/dL (calc)
Non-HDL Cholesterol (Calc): 114 mg/dL (calc) (ref <130)
Total CHOL/HDL Ratio: 4.6 (calc) (ref <5.0)
Triglycerides: 164 mg/dL — ABNORMAL HIGH (ref <150)

## 2020-03-06 LAB — T3, FREE: T3, Free: 3.8 pg/mL (ref 2.3–4.2)

## 2020-03-06 LAB — TESTOSTERONE: Testosterone: 171 ng/dL — ABNORMAL LOW (ref 250–827)

## 2020-03-06 LAB — VITAMIN D 25 HYDROXY (VIT D DEFICIENCY, FRACTURES): Vit D, 25-Hydroxy: 17 ng/mL — ABNORMAL LOW (ref 30–100)

## 2020-03-06 MED ORDER — VITAMIN D (ERGOCALCIFEROL) 1.25 MG (50000 UNIT) PO CAPS: 50000.0000 [IU] | ORAL_CAPSULE | ORAL | 2 refills | Status: AC

## 2020-03-06 NOTE — Addendum Note (Signed)
Addended by: Milinda Antis F on: 03/06/2020 09:39 PM   Modules accepted: Orders

## 2020-03-06 NOTE — Assessment & Plan Note (Signed)
Refill triamcinolone cream for his eczema/dermatitis.

## 2020-03-06 NOTE — Assessment & Plan Note (Signed)
Chronic back pain which he states keeps him from working and doing activities.

## 2020-03-06 NOTE — Assessment & Plan Note (Signed)
UnFortunately he continues to have compliance issues with his medications and appointments.  We will check his labs today. Already been referred to endocrinology has not followed up with him either.  He states that he is lasted another appointment to discuss his testosterone and his osteoporosis.  I will go ahead and draw the labs today will need today is I am not convinced that he is going to follow-up.

## 2020-08-08 DIAGNOSIS — R7989 Other specified abnormal findings of blood chemistry: Secondary | ICD-10-CM | POA: Diagnosis not present

## 2020-08-08 DIAGNOSIS — E039 Hypothyroidism, unspecified: Secondary | ICD-10-CM | POA: Diagnosis not present

## 2020-08-08 DIAGNOSIS — L409 Psoriasis, unspecified: Secondary | ICD-10-CM | POA: Diagnosis not present

## 2020-08-08 DIAGNOSIS — E559 Vitamin D deficiency, unspecified: Secondary | ICD-10-CM | POA: Diagnosis not present

## 2020-08-08 DIAGNOSIS — R718 Other abnormality of red blood cells: Secondary | ICD-10-CM | POA: Diagnosis not present

## 2020-08-08 DIAGNOSIS — E781 Pure hyperglyceridemia: Secondary | ICD-10-CM | POA: Diagnosis not present

## 2021-03-25 ENCOUNTER — Encounter (HOSPITAL_BASED_OUTPATIENT_CLINIC_OR_DEPARTMENT_OTHER): Payer: Self-pay | Admitting: Emergency Medicine

## 2021-03-25 ENCOUNTER — Emergency Department (HOSPITAL_BASED_OUTPATIENT_CLINIC_OR_DEPARTMENT_OTHER)
Admission: EM | Admit: 2021-03-25 | Discharge: 2021-03-25 | Disposition: A | Discharge status: Home / Self Care | Payer: Medicaid Other | Attending: Emergency Medicine | Admitting: Emergency Medicine

## 2021-03-25 ENCOUNTER — Other Ambulatory Visit: Payer: Self-pay

## 2021-03-25 DIAGNOSIS — R059 Cough, unspecified: Secondary | ICD-10-CM | POA: Diagnosis present

## 2021-03-25 DIAGNOSIS — R197 Diarrhea, unspecified: Secondary | ICD-10-CM | POA: Insufficient documentation

## 2021-03-25 DIAGNOSIS — Z5321 Procedure and treatment not carried out due to patient leaving prior to being seen by health care provider: Secondary | ICD-10-CM | POA: Diagnosis not present

## 2021-03-25 NOTE — ED Triage Notes (Signed)
Diarrhea x 1 days , multiple BM too many to count per pt . Denies vomiting , gl abd pain .  No fever

## 2023-06-22 ENCOUNTER — Emergency Department (HOSPITAL_COMMUNITY)
Admission: EM | Admit: 2023-06-22 | Discharge: 2023-06-22 | Disposition: A | Discharge status: Home / Self Care | Attending: Emergency Medicine | Admitting: Emergency Medicine

## 2023-06-22 ENCOUNTER — Other Ambulatory Visit: Payer: Self-pay

## 2023-06-22 ENCOUNTER — Encounter (HOSPITAL_COMMUNITY): Payer: Self-pay

## 2023-06-22 DIAGNOSIS — R000 Tachycardia, unspecified: Secondary | ICD-10-CM | POA: Diagnosis not present

## 2023-06-22 DIAGNOSIS — R112 Nausea with vomiting, unspecified: Secondary | ICD-10-CM

## 2023-06-22 DIAGNOSIS — R197 Diarrhea, unspecified: Secondary | ICD-10-CM | POA: Diagnosis present

## 2023-06-22 DIAGNOSIS — K529 Noninfective gastroenteritis and colitis, unspecified: Secondary | ICD-10-CM | POA: Diagnosis not present

## 2023-06-22 LAB — CBC WITH DIFFERENTIAL/PLATELET
Abs Immature Granulocytes: 0.14 10*3/uL — ABNORMAL HIGH (ref 0.00–0.07)
Basophils Absolute: 0 10*3/uL (ref 0.0–0.1)
Basophils Relative: 0 %
Eosinophils Absolute: 0.2 10*3/uL (ref 0.0–0.5)
Eosinophils Relative: 1 %
HCT: 47.7 % (ref 39.0–52.0)
Hemoglobin: 16.6 g/dL (ref 13.0–17.0)
Immature Granulocytes: 1 %
Lymphocytes Relative: 3 %
Lymphs Abs: 0.5 10*3/uL — ABNORMAL LOW (ref 0.7–4.0)
MCH: 27.9 pg (ref 26.0–34.0)
MCHC: 34.8 g/dL (ref 30.0–36.0)
MCV: 80.3 fL (ref 80.0–100.0)
Monocytes Absolute: 1.1 10*3/uL — ABNORMAL HIGH (ref 0.1–1.0)
Monocytes Relative: 6 %
Neutro Abs: 17.2 10*3/uL — ABNORMAL HIGH (ref 1.7–7.7)
Neutrophils Relative %: 89 %
Platelets: 295 10*3/uL (ref 150–400)
RBC: 5.94 MIL/uL — ABNORMAL HIGH (ref 4.22–5.81)
RDW: 13.2 % (ref 11.5–15.5)
WBC: 19.2 10*3/uL — ABNORMAL HIGH (ref 4.0–10.5)
nRBC: 0 % (ref 0.0–0.2)

## 2023-06-22 LAB — COMPREHENSIVE METABOLIC PANEL
ALT: 20 U/L (ref 0–44)
AST: 24 U/L (ref 15–41)
Albumin: 4.4 g/dL (ref 3.5–5.0)
Alkaline Phosphatase: 70 U/L (ref 38–126)
Anion gap: 17 — ABNORMAL HIGH (ref 5–15)
BUN: 20 mg/dL (ref 6–20)
CO2: 20 mmol/L — ABNORMAL LOW (ref 22–32)
Calcium: 9.3 mg/dL (ref 8.9–10.3)
Chloride: 99 mmol/L (ref 98–111)
Creatinine, Ser: 1 mg/dL (ref 0.61–1.24)
GFR, Estimated: >60 mL/min (ref >60)
Glucose, Bld: 151 mg/dL — ABNORMAL HIGH (ref 70–99)
Potassium: 3.7 mmol/L (ref 3.5–5.1)
Sodium: 136 mmol/L (ref 135–145)
Total Bilirubin: 0.8 mg/dL (ref 0.0–1.2)
Total Protein: 7.2 g/dL (ref 6.5–8.1)

## 2023-06-22 LAB — CBG MONITORING, ED: Glucose-Capillary: 147 mg/dL — ABNORMAL HIGH (ref 70–99)

## 2023-06-22 LAB — LIPASE, BLOOD: Lipase: 23 U/L (ref 11–51)

## 2023-06-22 MED ORDER — ONDANSETRON 4 MG PO TBDP: 4.0000 mg | ORAL_TABLET | Freq: Three times a day (TID) | ORAL | 0 refills | Status: AC | PRN

## 2023-06-22 MED ORDER — ONDANSETRON HCL 4 MG/2ML IJ SOLN: 4.0000 mg | Freq: Four times a day (QID) | INTRAMUSCULAR | Status: DC | PRN

## 2023-06-22 MED ORDER — PANTOPRAZOLE SODIUM 40 MG IV SOLR
40.0000 mg | Freq: Once | INTRAVENOUS | Status: AC
  Administered 2023-06-22: 40 mg via INTRAVENOUS
  Filled 2023-06-22: qty 10

## 2023-06-22 MED ORDER — SODIUM CHLORIDE 0.9 % IV BOLUS
1000.0000 mL | Freq: Once | INTRAVENOUS | Status: AC
  Administered 2023-06-22: 1000 mL via INTRAVENOUS

## 2023-06-22 MED ORDER — ONDANSETRON HCL 4 MG/2ML IJ SOLN
4.0000 mg | Freq: Once | INTRAMUSCULAR | Status: AC
  Administered 2023-06-22: 4 mg via INTRAVENOUS
  Filled 2023-06-22: qty 2

## 2023-06-22 NOTE — Discharge Instructions (Addendum)
 It was a pleasure caring for you today. Please follow up with your primary care provider. Seek emergency care if experiencing any new or worsening symptoms.

## 2023-06-22 NOTE — ED Provider Notes (Signed)
 [place] EMERGENCY DEPARTMENT AT [facility] Provider Note   CSN: [id number] Arrival date & time: 06/22/23  0309     History  Chief Complaint  Patient presents with   Emesis    Mark Zuniga is a 28 y.o. male with PMHx migraines, thyroid disease who presents to ED concerned for nausea, vomiting, and diarrhea that started around 17 hours ago. Patient stating that a family member recently had similar symptoms. Patient stating that he is prone to fainting - patient was experiencing an episode of diarrhea when he started feeling lightheaded. Patient attempted to grab a cold towel, but ended up synopsizing on the toilet instead. Patient denies any other prodromal symptoms such as chest pain or SOB prior to his syncopal episode. Patient unsure how long he was unconscious for, but thinks that it was less than 5 min. Denies any injury from syncope. Patient stating that he knows he is very dehydrated. Last episode of vomiting and diarrhea around 1.5 hours ago.   Denies fever, chest pain, dyspnea, abdominal pain, dysuria, hematuria, hematochezia, hematemesis.    Emesis      Home Medications Prior to Admission medications   Medication Sig Start Date End Date Taking? Authorizing Provider  ondansetron (ZOFRAN-ODT) 4 MG disintegrating tablet Take 1 tablet (4 mg total) by mouth every 8 (eight) hours as needed for nausea. 06/22/23  Yes Valrie Hart F, PA-C  clomiPHENE (CLOMID) 50 MG tablet 1/4 tab daily Patient not taking: Reported on 03/05/2020 07/28/19   Romero Belling, MD  fluticasone [facility]) 50 MCG/ACT nasal spray Place 1 spray into both nostrils 2 (two) times daily as needed for allergies or rhinitis. 04/21/19   Salley Scarlet, MD  levothyroxine (SYNTHROID) 112 MCG tablet Take 1 tablet (112 mcg total) by mouth daily before breakfast. 04/29/19   Romero Belling, MD  triamcinolone cream (KENALOG) 0.1 % Apply 1 application topically 2 (two) times daily. Patient not taking:  Reported on 03/05/2020 07/21/19   Salley Scarlet, MD  Vitamin D, Ergocalciferol, (DRISDOL) 1.25 MG (50000 UNIT) CAPS capsule Take 1 capsule (50,000 Units total) by mouth every 7 (seven) days. 03/06/20   Salley Scarlet, MD      Allergies    Sulfa drugs cross reactors    Review of Systems   Review of Systems  Gastrointestinal:  Positive for vomiting.    Physical Exam Updated Vital Signs BP 117/64   Pulse 92   Temp 98 F (36.7 C) (Oral)   Resp 18   Ht 5' 11.5" (1.816 m)   Wt 108 kg   SpO2 98%   BMI 32.74 kg/m  Physical Exam Vitals and nursing note reviewed.  Constitutional:      General: He is not in acute distress.    Appearance: He is not ill-appearing or toxic-appearing.  HENT:     Head: Normocephalic and atraumatic.     Mouth/Throat:     Mouth: Mucous membranes are moist.  Eyes:     General: No scleral icterus.       Right eye: No discharge.        Left eye: No discharge.     Conjunctiva/sclera: Conjunctivae normal.  Cardiovascular:     Rate and Rhythm: Regular rhythm. Tachycardia present.     Pulses: Normal pulses.     Heart sounds: Normal heart sounds. No murmur heard. Pulmonary:     Effort: Pulmonary effort is normal. No respiratory distress.     Breath sounds: Normal breath sounds. No wheezing,  rhonchi or rales.  Abdominal:     General: Abdomen is flat. Bowel sounds are normal. There is no distension.     Palpations: Abdomen is soft. There is no mass.     Tenderness: There is no abdominal tenderness.  Musculoskeletal:     Right lower leg: No edema.     Left lower leg: No edema.  Skin:    General: Skin is warm and dry.     Findings: No rash.  Neurological:     General: No focal deficit present.     Mental Status: He is alert and oriented to person, place, and time. Mental status is at baseline.  Psychiatric:        Mood and Affect: Mood normal.     ED Results / Procedures / Treatments   Labs (all labs ordered are listed, but only abnormal  results are displayed) Labs Reviewed  CBC WITH DIFFERENTIAL/PLATELET - Abnormal; Notable for the following components:      Result Value   WBC 19.2 (*)    RBC 5.94 (*)    Neutro Abs 17.2 (*)    Lymphs Abs 0.5 (*)    Monocytes Absolute 1.1 (*)    Abs Immature Granulocytes 0.14 (*)    All other components within normal limits  COMPREHENSIVE METABOLIC PANEL - Abnormal; Notable for the following components:   CO2 20 (*)    Glucose, Bld 151 (*)    Anion gap 17 (*)    All other components within normal limits  CBG MONITORING, ED - Abnormal; Notable for the following components:   Glucose-Capillary 147 (*)    All other components within normal limits  LIPASE, BLOOD    EKG EKG Interpretation Date/Time:  Tuesday June 22 2023 03:15:24 EDT Ventricular Rate:  108 PR Interval:  155 QRS Duration:  91 QT Interval:  318 QTC Calculation: 427 R Axis:   95  Text Interpretation: Sinus tachycardia Borderline right axis deviation Borderline T wave abnormalities Confirmed by Zadie Rhine ([id number]) on 06/22/2023 3:33:02 AM  Radiology No results found.  Procedures Procedures    Medications Ordered in ED Medications  sodium chloride 0.9 % bolus 1,000 mL (0 mLs Intravenous Stopped 06/22/23 0507)  pantoprazole (PROTONIX) injection 40 mg (40 mg Intravenous Given 06/22/23 0507)  sodium chloride 0.9 % bolus 1,000 mL (1,000 mLs Intravenous New Bag/Given 06/22/23 0510)  ondansetron (ZOFRAN) injection 4 mg (4 mg Intravenous Given 06/22/23 0547)    ED Course/ Medical Decision Making/ A&P                                 Medical Decision Making Amount and/or Complexity of Data Reviewed Labs: ordered.  Risk Prescription drug management.   This patient presents to the ED for concern of nausea, vomiting, diarrhea, syncope this involves an extensive number of treatment options, and is a complaint that carries with it a high risk of complications and morbidity.  The differential diagnosis includes  acute viral gastroenteritis, UTI, appendicitis, cholecystitis, pancreatitis, nephrolithiasis, constipation, SBO, testicular/ovarian torsion.   Co morbidities that complicate the patient evaluation  migraines, thyroid disease   Additional history obtained:  No PCP in chart. Will refer to community clinic.   Problem List / ED Course / Critical interventions / Medication management  Patient presents to ED concerned for nausea, vomiting, diarrhea that started within the last 24 hours. Patient also endorsing syncopal episode before coming to ED. No alarming prodromal  symptoms. Family member with similar symptoms at home. Patient with epigastric burning, but denies abdominal pain. Physical exam with mild tachycardia in the lower 100's which resolved with IV fluids. Rest of vitals and physical exam reassuring. Patient denying any other infectious complaints today. I Ordered, and personally interpreted labs.  CBC with leukocytosis at 19.2 which seems to be due to patient's current viral gastroenteritis.  No anemia.  Lipase within normal limits.  CMP with mildly low CO2 at 20. There is a mild anion gap which seems to be d/t electrolyte loss from the vomiting and diarrhea.  The patient was maintained on a cardiac monitor.  I personally viewed and interpreted the cardiac monitored which showed an underlying rhythm of: sinus rhythm Patient tolerating PO after Zofran and 2L IV fluids. It appears that patient's symptoms are d/t viral gastroenteritis along with dehydration leading to vasovagal syncope. I will prescribe patient zofran to help control symptoms. Will also provide work note. Patient does not meet criteria for inpatient admission at this time. I recommended following up with PCP. Patient verbalized understanding of plan. Staffed with Dr. Bebe Shaggy I have reviewed the patients home medicines and have made adjustments as needed Patient afebrile with stable vitals.  Provided with return precautions.   Discharged in good condition.   Social Determinants of Health:  none           Final Clinical Impression(s) / ED Diagnoses Final diagnoses:  Gastroenteritis  Nausea vomiting and diarrhea    Rx / DC Orders ED Discharge Orders          Ordered    ondansetron (ZOFRAN-ODT) 4 MG disintegrating tablet  Every 8 hours PRN        06/22/23 0542              Dorthy Cooler, PA-C 06/22/23 2841    Zadie Rhine, MD 06/22/23 [phone]

## 2023-06-22 NOTE — ED Triage Notes (Addendum)
 Pt BIB [facility] from home d/t N/V/D since 10 am yesterday.  Pt states he also had a syncopal episode on toilet when having a BM.  Pt states he cannot hold anything down.

## 2023-06-22 NOTE — ED Notes (Signed)
 Pt able to drink Water and apple juice without complaint. RN notified.

## 2023-12-31 ENCOUNTER — Encounter: Payer: Self-pay | Admitting: Internal Medicine

## 2023-12-31 ENCOUNTER — Ambulatory Visit: Admitting: Internal Medicine

## 2023-12-31 VITALS — BP 120/90 | HR 89 | Temp 98.0°F | Ht 71.0 in | Wt 235.0 lb

## 2023-12-31 DIAGNOSIS — J343 Hypertrophy of nasal turbinates: Secondary | ICD-10-CM | POA: Diagnosis not present

## 2023-12-31 DIAGNOSIS — L409 Psoriasis, unspecified: Secondary | ICD-10-CM | POA: Diagnosis not present

## 2023-12-31 DIAGNOSIS — J3089 Other allergic rhinitis: Secondary | ICD-10-CM

## 2023-12-31 MED ORDER — CETIRIZINE HCL 10 MG PO TABS: 10.0000 mg | ORAL_TABLET | Freq: Every day | ORAL | 5 refills | Status: AC

## 2023-12-31 NOTE — Progress Notes (Signed)
 NEW PATIENT  Date of Service/Encounter:  12/31/23  Consult requested by: Pcp, No   Subjective:   Mark Zuniga (DOB: 1995-09-24) is a 28 y.o. male who presents to the clinic on 12/31/2023 with a chief complaint of Allergic Rhinitis  (Pt referred for nasal congestion. ) .    History obtained from: chart review and patient.   Rhinitis:  Started around high school.  Worsened ever since he had COVID followed by sinus infection.  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  Discussed use of Flonase /anti histamines and obtain CT sinus.  Referred to Allergy also.   11/08/2023: seen by Derm for psoriasis on high potency topical steroids, clobetasol.   09/10/2023: seen by Thedora NP ENT for hearing loss, tinnitus, congestion.  Discussed CT sinus, use Flonase /anti histamine.  Also with mild SNHL, use ear protection.   Past Medical History: Past Medical History:  Diagnosis Date   Migraine    Thyroid  disease     Past Surgical History: History reviewed. No pertinent surgical history.  Family History: Family History  Problem Relation Age of Onset   Allergic rhinitis Mother    Allergic rhinitis Father    Osteoporosis Maternal Grandmother     Social History:  Flooring in bedroom: carpet Pets: cat and dog Tobacco use/exposure: 2 years in the past  Job: [employer]   Medication List:  Allergies as of 12/31/2023        Reactions   Sulfa Drugs Cross Reactors Other (See Comments)   unknown        Medication List        Accurate as of December 31, 2023  9:28 AM. If you have any questions, ask your nurse or doctor.          cholecalciferol 25 MCG (1000 UNIT) tablet Commonly known as: VITAMIN D3 Take 5,000 Units by mouth daily.   clobetasol cream 0.05 % Commonly known as: TEMOVATE Apply 1 Application topically 2 (two) times daily.   Clobetasol Propionate 0.05 % shampoo Apply topically.   clomiPHENE  50 MG tablet Commonly known as: CLOMID  1/4 tab daily   fluticasone  50 MCG/ACT nasal spray Commonly known as: FLONASE  Place 1 spray into both nostrils 2 (two) times daily as needed for allergies or rhinitis.   levothyroxine  112 MCG tablet Commonly known as: SYNTHROID  Take 1 tablet (112 mcg total) by mouth daily before breakfast.   MULTIPLE VITAMIN PO Take 1 tablet by mouth daily.   ondansetron  4 MG disintegrating tablet Commonly known as: ZOFRAN -ODT Take 1 tablet (4 mg total) by mouth every 8 (eight) hours as needed for nausea.   triamcinolone  cream 0.1 % Commonly known as: KENALOG  Apply 1 application topically 2 (two) times daily.   Vitamin D  (Ergocalciferol ) 1.25 MG (50000 UNIT) Caps capsule Commonly known as: DRISDOL  Take 1 capsule (50,000 Units total) by mouth every 7 (seven) days.         REVIEW OF SYSTEMS: Pertinent positives and negatives discussed in HPI.  Objective:   Physical Exam: BP (!) 120/90 (BP Location: Right Arm, Patient Position: Sitting, Cuff Size: Large)   Pulse 89   Temp 98 F (36.7 C) (Temporal)   Ht 5' 11 (1.803 m)   Wt 235 lb (106.6 kg)   SpO2 98%   BMI 32.78 kg/m  Body mass index is 32.78 kg/m. GEN: alert, well developed HEENT: clear conjunctiva, nose with + mild inferior turbinate hypertrophy, pale nasal mucosa, slight clear rhinorrhea, + cobblestoning HEART: regular rate and rhythm, no murmur LUNGS: clear to auscultation bilaterally, no  coughing, unlabored respiration ABDOMEN: soft, non distended  SKIN: no rashes or lesions  Assessment:   1. Other allergic rhinitis   2. Nasal turbinate hypertrophy   3. Psoriasis     Plan/Recommendations:  Other Allergic Rhinitis: - Due to turbinate hypertrophy, seasonal symptoms and unresponsive to over the counter meds, will perform skin testing to identify aeroallergen triggers.   - Use nasal saline spray and then blow your nose.   - Use Zyrtec  10 mg daily as needed for runny nose, sneezing, itchy watery eyes.  - Please get CT sinus with ENT.    Rash - Discussed consistent with psoriasis.  Please follow up with Dermatology. - Continue use of Clobetasol 0.05% twice daily for flare ups below neck.  Maximum use 2 weeks. - Continue use of hydrocortisone  2.5% twice daily for flare ups above neck.  Maximum 2 weeks.    Hold all anti-histamines (Xyzal, Allegra, Zyrtec , Claritin, Benadryl , Pepcid) 3 days prior to next visit.   Follow up: 930 on 10/13 for skin testing 1-55, IDs okay    [name], MD [facility] 

## 2023-12-31 NOTE — Patient Instructions (Addendum)
 Other Allergic Rhinitis: - Use nasal saline spray and then blow your nose.   - Use Zyrtec  10 mg daily as needed for runny nose, sneezing, itchy watery eyes.   Psoriasis  - Please follow up with Dermatology.  Hold all anti-histamines (Xyzal, Allegra, Zyrtec , Claritin, Benadryl , Pepcid) 3 days prior to next visit.   Follow up: 930 on 10/13 for skin testing 1-55

## 2024-01-10 ENCOUNTER — Ambulatory Visit: Admitting: Internal Medicine

## 2024-01-10 DIAGNOSIS — J301 Allergic rhinitis due to pollen: Secondary | ICD-10-CM | POA: Diagnosis not present

## 2024-01-10 MED ORDER — MOMETASONE FUROATE 50 MCG/ACT NA SUSP: 2.0000 | Freq: Every day | NASAL | 5 refills | Status: AC

## 2024-01-10 MED ORDER — AZELASTINE HCL 0.1 % NA SOLN: 2.0000 | Freq: Two times a day (BID) | NASAL | 5 refills | Status: AC | PRN

## 2024-01-10 NOTE — Progress Notes (Signed)
 FOLLOW UP Date of Service/Encounter:  01/10/24   Subjective:  Mark Zuniga (DOB: [birth year]) is a 28 y.o. male who [facility] 01[facility]10/2024 for follow up for skin testing.   History obtained from: chart review and patient.  Anti histamines held.   Past Medical History: Past Medical History:  Diagnosis Date   Migraine    Thyroid  disease     Objective:  There were no vitals taken for this visit. There is no height or weight on file to calculate BMI. Physical Exam: GEN: alert, well developed HEENT: clear conjunctiva, MMM LUNGS: unlabored respiration  Skin Testing:  Skin prick testing was placed, which includes aeroallergens/foods, histamine control, and saline control.  Verbal consent was obtained prior to placing test.  Patient tolerated procedure well.  Allergy testing results were read and interpreted by myself, documented by clinical staff. Adequate positive and negative control.  Positive results to:  Results discussed with patient/family.  Airborne Adult Perc - 01/10/24 0926     Time Antigen Placed 9073    Allergen Manufacturer Jestine    Location Back    Number of Test 55    1. Control-Buffer 50% Glycerol Negative    2. Control-Histamine 3+    3. Bahia Negative    4. French Southern Territories Negative    5. Johnson Negative    6. [place]  Blue 3+    7. Meadow Fescue 3+    8. Perennial Rye 3+    9. Timothy 3+    10. Ragweed Mix Negative    11. Cocklebur 2+    12. Plantain,  English Negative    13. Baccharis Negative    14. Dog Fennel 3+    15. Russian Thistle Negative    16. Lamb's Quarters Negative    17. Sheep Sorrell Negative    18. Rough Pigweed Negative    19. Marsh Elder, Rough Negative    20. Mugwort, Common Negative    21. Box, Elder 2+    22. Cedar, red 3+    23. Sweet Gum Negative    24. Pecan Pollen Negative    25. Pine Mix Negative    26. Walnut, Black Pollen Negative    27. Red Mulberry Negative    28. Ash Mix 2+     29. Birch Mix 3+    30. Beech American 3+    31. Cottonwood, Guinea-Bissau 2+    32. Hickory, White 3+    33. Maple Mix Negative    34. Oak, [place] Mix 3+    35. Sycamore Eastern Negative    36. Alternaria Alternata Negative    37. Cladosporium Herbarum Negative    38. Aspergillus Mix Negative    39. Penicillium Mix Negative    40. Bipolaris Sorokiniana (Helminthosporium) Negative    41. Drechslera Spicifera (Curvularia) Negative    42. Mucor Plumbeus Negative    43. Fusarium Moniliforme Negative    44. Aureobasidium Pullulans (pullulara) Negative    45. Rhizopus Oryzae Negative    46. Botrytis Cinera Negative    47. Epicoccum Nigrum Negative    48. Phoma Betae Negative    49. Dust Mite Mix Negative    50. Cat Hair 10,000 BAU/ml Negative    51.  Dog Epithelia Negative    52. Mixed Feathers Negative    53. Horse Epithelia Negative    54. Cockroach, German Negative    55. Tobacco Leaf Negative  Intradermal - 01/10/24 1028     Time Antigen Placed 1010    Allergen Manufacturer Greer    Location Arm    Number of Test 13    Control Negative    Bahia Negative    French Southern Territories Negative    Johnson Negative    Ragweed Mix Negative    Mold 1 Negative    Mold 2 Negative    Mold 3 Negative    Mold 4 Negative    Mite Mix Negative    Cat Negative    Dog Negative    Cockroach Negative           Assessment:   1. Seasonal allergic rhinitis due to pollen     Plan/Recommendations:   Allergic Rhinitis: - Due to turbinate hypertrophy, seasonal symptoms and unresponsive to over the counter meds, will perform skin testing to identify aeroallergen triggers.   - Positive skin test 12/2023: trees, grasses, weeds  - Avoidance measures discussed. - Use nasal saline spray and then blow your nose.   - Use Nasonex  2 sprays each nostril daily. Aim upward and outward. Get OTC if not covered.   - Use Azelastine 2 sprays each nostril twice daily as needed for runny nose, drainage,  sneezing, congestion. Aim upward and outward. - Use Zyrtec  10 mg daily .  - Please get CT sinus with ENT.   - Consider allergy shots as long term control of your symptoms by teaching your immune system to be more tolerant of your allergy triggers    Rash - Discussed consistent with psoriasis.  Please follow up with Dermatology. - Continue use of Clobetasol 0.05% twice daily for flare ups below neck.  Maximum use 2 weeks. - Continue use of hydrocortisone  2.5% twice daily for flare ups above neck.  Maximum 2 weeks.   ALLERGEN AVOIDANCE MEASURES  Pollen Avoidance Pollen levels are highest during the mid-day and afternoon.  Consider this when planning outdoor activities. Avoid being outside when the grass is being mowed, or wear a mask if the pollen-allergic person must be the one to mow the grass. Keep the windows closed to keep pollen outside of the home. Use an air conditioner to filter the air. Take a shower, wash hair, and change clothing after working or playing outdoors during pollen season.      Return in about 2 months (around 03/11/2024).  Mark Blanch, MD [facility] 

## 2024-01-10 NOTE — Patient Instructions (Addendum)
 Allergic Rhinitis: - Positive skin test 12/2023: trees, grasses, weeds  - Use nasal saline spray and then blow your nose.   - Use Nasonex  (Mometasone ) 2 sprays each nostril daily. Aim upward and outward. - Use Azelastine 2 sprays each nostril twice daily as needed for runny nose, drainage, sneezing, congestion. Aim upward and outward. - Use Zyrtec  10 mg daily .  - Please get CT sinus with ENT.   - Consider allergy shots as long term control of your symptoms by teaching your immune system to be more tolerant of your allergy triggers    Rash - Discussed consistent with psoriasis.  Please follow up with Dermatology. - Continue use of Clobetasol 0.05% twice daily for flare ups below neck.  Maximum use 2 weeks. - Continue use of hydrocortisone  2.5% twice daily for flare ups above neck.  Maximum 2 weeks.   ALLERGEN AVOIDANCE MEASURES  Pollen Avoidance Pollen levels are highest during the mid-day and afternoon.  Consider this when planning outdoor activities. Avoid being outside when the grass is being mowed, or wear a mask if the pollen-allergic person must be the one to mow the grass. Keep the windows closed to keep pollen outside of the home. Use an air conditioner to filter the air. Take a shower, wash hair, and change clothing after working or playing outdoors during pollen season.

## 2024-03-13 ENCOUNTER — Encounter: Payer: Self-pay | Admitting: Internal Medicine

## 2024-03-13 ENCOUNTER — Ambulatory Visit: Admitting: Internal Medicine

## 2024-03-13 VITALS — BP 132/82 | HR 81 | Temp 98.6°F | Resp 14

## 2024-03-13 DIAGNOSIS — J301 Allergic rhinitis due to pollen: Secondary | ICD-10-CM | POA: Diagnosis not present

## 2024-03-13 MED ORDER — AZELASTINE-FLUTICASONE 137-50 MCG/ACT NA SUSP: 2.0000 | Freq: Two times a day (BID) | NASAL | 5 refills | Status: AC

## 2024-03-13 MED ORDER — LEVOCETIRIZINE DIHYDROCHLORIDE 5 MG PO TABS: 5.0000 mg | ORAL_TABLET | Freq: Every evening | ORAL | 5 refills | Status: AC

## 2024-03-13 NOTE — Progress Notes (Signed)
° °  FOLLOW UP Date of Service/Encounter:  03/13/2024   Subjective:  Mark Zuniga (DOB: 26-Feb-1996) is a 28 y.o. male who [facility] 03[facility]13/2024 for follow up for allergic rhinitis.   History obtained from: chart review and patient. Last seen on 01/10/2024 with me for skin testing: AR- positive for pollens, started on Nasonex , Azelastine , Zyrtec ; discussed getting sinus CT as recommended by ENT.  Rash- psoriasis, f/u with Derm.   Still having trouble with frequent congestion, runny nose, drainage especially with the cooler weather. Has not gotten the sinus CT yet due to busy job.  Using Azelastine  and Zyrtec  with some relief initially but then symptoms have recurred.  No ocular sxs.  Seen by [facility] for psoriasis; due to f/u in 3 months.    Past Medical History: Past Medical History:  Diagnosis Date   Migraine    Thyroid  disease     Objective:  BP 132/82   Pulse 81   Temp 98.6 F (37 C)   Resp 14   SpO2 96%  There is no height or weight on file to calculate BMI. Physical Exam: GEN: alert, well developed HEENT: clear conjunctiva, nose with mild inferior turbinate hypertrophy, pink nasal mucosa, +clear rhinorrhea, + cobblestoning HEART: regular rate and rhythm, no murmur LUNGS: clear to auscultation bilaterally, no coughing, unlabored respiration SKIN: no apparent rashes or lesions  Assessment:   1. Seasonal allergic rhinitis due to pollen     Plan/Recommendations:   Allergic Rhinitis: - Uncontrolled, will switch to Dymista +Xyzal .  Also consider AIT - Positive skin test 12/2023: trees, grasses, weeds  - Use nasal saline spray and then blow your nose.   - Use Dymista  1-2 sprays each nostril twice daily. Aim upward and outward. - Use Xyzal  (Levocetirizine) 5mg  daily.  This replaces Zyrtec .   - Please get CT sinus with ENT.   [facility]  [address]   [address]  [place], [place] [address]  [phone]   - Consider allergy  shots as long term control of your symptoms by teaching your immune system to be more tolerant of your allergy  triggers.        Return in about 6 months (around 09/11/2024).  [name], MD [facility]

## 2024-03-13 NOTE — Patient Instructions (Addendum)
 Allergic Rhinitis: - Positive skin test 12/2023: trees, grasses, weeds  - Use nasal saline spray and then blow your nose.   - Use Dymista  1-2 sprays each nostril twice daily. Aim upward and outward. - Use Xyzal  (Levocetirizine) 5mg  daily.  This replaces Zyrtec .   - Please get CT sinus with ENT.   [facility]  [address]  [address]  [place], [place] [address]  [phone]   - Consider allergy  shots as long term control of your symptoms by teaching your immune system to be more tolerant of your allergy  triggers.

## 2024-09-15 ENCOUNTER — Ambulatory Visit: Admitting: Allergy & Immunology
